# Patient Record
Sex: Female | Born: 1995 | Race: White | Hispanic: No | Marital: Married
Health system: Midwestern US, Community
[De-identification: ages and names within clinical notes are randomized; demographics above are authoritative.]

## PROBLEM LIST (undated history)

## (undated) DIAGNOSIS — R519 Headache, unspecified: Secondary | ICD-10-CM

## (undated) DIAGNOSIS — F419 Anxiety disorder, unspecified: Secondary | ICD-10-CM

## (undated) DIAGNOSIS — J45909 Unspecified asthma, uncomplicated: Secondary | ICD-10-CM

## (undated) DIAGNOSIS — K219 Gastro-esophageal reflux disease without esophagitis: Secondary | ICD-10-CM

## (undated) DIAGNOSIS — J309 Allergic rhinitis, unspecified: Secondary | ICD-10-CM

## (undated) DIAGNOSIS — J4520 Mild intermittent asthma, uncomplicated: Secondary | ICD-10-CM

## (undated) DIAGNOSIS — E66812 Obesity, class 2: Secondary | ICD-10-CM

## (undated) DIAGNOSIS — J4531 Mild persistent asthma with (acute) exacerbation: Principal | ICD-10-CM

## (undated) DIAGNOSIS — E6609 Other obesity due to excess calories: Secondary | ICD-10-CM

## (undated) DIAGNOSIS — F411 Generalized anxiety disorder: Secondary | ICD-10-CM

## (undated) DIAGNOSIS — Z6839 Body mass index (BMI) 39.0-39.9, adult: Secondary | ICD-10-CM

## (undated) DIAGNOSIS — Z0001 Encounter for general adult medical examination with abnormal findings: Secondary | ICD-10-CM

## (undated) HISTORY — PX: EYE SURGERY: SHX253

## (undated) HISTORY — PX: OTHER SURGICAL HISTORY: SHX169

## (undated) HISTORY — DX: Anxiety disorder, unspecified: F41.9

## (undated) HISTORY — DX: Unspecified asthma, uncomplicated: J45.909

## (undated) HISTORY — DX: Allergic rhinitis, unspecified: J30.9

## (undated) HISTORY — DX: Headache, unspecified: R51.9

## (undated) HISTORY — DX: Gastro-esophageal reflux disease without esophagitis: K21.9

## (undated) HISTORY — PX: TYMPANOSTOMY TUBE PLACEMENT: SHX32

---
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
## 2014-01-20 ENCOUNTER — Other Ambulatory Visit: Payer: Self-pay | Admitting: Gynecology

## 2014-01-21 ENCOUNTER — Ambulatory Visit (INDEPENDENT_AMBULATORY_CARE_PROVIDER_SITE_OTHER): Payer: Federal, State, Local not specified - PPO | Admitting: Gynecology

## 2014-01-21 ENCOUNTER — Encounter: Payer: Self-pay | Admitting: Gynecology

## 2014-01-21 VITALS — BP 120/82 | Ht 63.0 in | Wt 185.0 lb

## 2014-01-21 DIAGNOSIS — B3731 Acute candidiasis of vulva and vagina: Secondary | ICD-10-CM

## 2014-01-21 DIAGNOSIS — B373 Candidiasis of vulva and vagina: Secondary | ICD-10-CM

## 2014-01-21 DIAGNOSIS — Z833 Family history of diabetes mellitus: Secondary | ICD-10-CM

## 2014-01-21 DIAGNOSIS — N898 Other specified noninflammatory disorders of vagina: Secondary | ICD-10-CM

## 2014-01-21 DIAGNOSIS — Z01419 Encounter for gynecological examination (general) (routine) without abnormal findings: Secondary | ICD-10-CM

## 2014-01-21 DIAGNOSIS — R635 Abnormal weight gain: Secondary | ICD-10-CM

## 2014-01-21 LAB — CBC WITH DIFFERENTIAL/PLATELET
BASOS ABS: 0 10*3/uL (ref 0.0–0.1)
Basophils Relative: 0 % (ref 0–1)
EOS ABS: 0.3 10*3/uL (ref 0.0–0.7)
EOS PCT: 4 % (ref 0–5)
HCT: 41.1 % (ref 36.0–46.0)
Hemoglobin: 14.2 g/dL (ref 12.0–15.0)
LYMPHS PCT: 32 % (ref 12–46)
Lymphs Abs: 2.4 10*3/uL (ref 0.7–4.0)
MCH: 32 pg (ref 26.0–34.0)
MCHC: 34.5 g/dL (ref 30.0–36.0)
MCV: 92.6 fL (ref 78.0–100.0)
Monocytes Absolute: 0.7 10*3/uL (ref 0.1–1.0)
Monocytes Relative: 9 % (ref 3–12)
NEUTROS PCT: 55 % (ref 43–77)
Neutro Abs: 4.2 10*3/uL (ref 1.7–7.7)
PLATELETS: 282 10*3/uL (ref 150–400)
RBC: 4.44 MIL/uL (ref 3.87–5.11)
RDW: 12.9 % (ref 11.5–15.5)
WBC: 7.6 10*3/uL (ref 4.0–10.5)

## 2014-01-21 LAB — WET PREP FOR TRICH, YEAST, CLUE
Clue Cells Wet Prep HPF POC: NONE SEEN
Trich, Wet Prep: NONE SEEN

## 2014-01-21 MED ORDER — FLUCONAZOLE 150 MG PO TABS: 150.0000 mg | ORAL_TABLET | Freq: Once | ORAL | Status: DC

## 2014-01-21 MED ORDER — NORGESTIMATE-ETH ESTRADIOL 0.25-35 MG-MCG PO TABS: 1.0000 | ORAL_TABLET | Freq: Every day | ORAL | Status: DC

## 2014-01-21 NOTE — Patient Instructions (Addendum)
Human Papillomavirus (HPV) Gardasil Vaccine What You Need to Know WHAT IS HPV?  Genital human papillomavirus (HPV) is the most common sexually transmitted virus in the [place]. More than half of sexually active men and women are infected with HPV at some time in their lives.  About 20 million Americans are currently infected, and about 6 million more get infected each year. HPV is usually spread through sexual contact.  Most HPV infections do not cause any symptoms and go away on their own. But HPV can cause cervical cancer in women. Cervical cancer is the 2nd leading cause of cancer deaths among women around the world. In the [place], about 12,000 women get cervical cancer every year and about 4,000 are expected to die from it.  HPV is also associated with several less common cancers, such as vaginal and vulvar cancers in women, and anal and oropharyngeal (back of the throat, including base of tongue and tonsils) cancers in both men and women. HPV can also cause genital warts and warts in the throat.  There is no cure for HPV infection, but some of the problems it causes can be treated. HPV VACCINE: WHY GET VACCINATED?  The HPV vaccine you are getting is 1 of 2 vaccines that can be given to prevent HPV. It may be given to both males and females.  This vaccine can prevent most cases of cervical cancer in females, if it is given before exposure to the virus. In addition, it can prevent vaginal and vulvar cancer in females, and genital warts and anal cancer in both males and females.  Protection from HPV vaccine is expected to be long-lasting. But vaccination is not a substitute for cervical cancer screening. Women should still get regular Pap tests. WHO SHOULD GET THIS HPV VACCINE AND WHEN? HPV vaccine is given as a 3-dose series.  1st Dose: Now.  2nd Dose: 1 to 2 months after Dose 1.  3rd Dose: 6 months after Dose 1. Additional (booster) doses are not recommended. Routine  Vaccination This HPV vaccine is recommended for girls and boys 11 or 19 years of age. It may be given starting at age 9. Why is HPV vaccine recommended at 11 or 19 years of age?  HPV infection is easily acquired, even with only one sex partner. That is why it is important to get HPV vaccine before any sexual contact takes place. Also, response to the vaccine is better at this age than at older ages. Catch-Up Vaccination This vaccine is recommended for the following people who have not completed the 3-dose series:   Females 13 through 19 years of age.  Males 13 through 19 years of age. This vaccine may be given to men 22 through 19 years of age who have not completed the 3-dose series. It is recommended for men through age 26 who have sex with men or whose immune system is weakened because of HIV infection, other illness, or medications.  HPV vaccine may be given at the same time as other vaccines. SOME PEOPLE SHOULD NOT GET HPV VACCINE OR SHOULD WAIT  Anyone who has ever had a life-threatening allergic reaction to any other component of HPV vaccine, or to a previous dose of HPV vaccine, should not get the vaccine. Tell your doctor if the person getting vaccinated has any severe allergies, including an allergy to yeast.  HPV vaccine is not recommended for pregnant women. However, receiving HPV vaccine when pregnant is not a reason to consider terminating the pregnancy.   Women who are breastfeeding may get the vaccine.  People who are mildly ill when a dose of HPV is planned can still be vaccinated. People with a moderate or severe illness should wait until they are better. WHAT ARE THE RISKS FROM THIS VACCINE?  This HPV vaccine has been used in the [place] and around the world for about 6 years and has been very safe.  However, any medicine could possibly cause a serious problem, such as a severe allergic reaction. The risk of any vaccine causing a serious injury, or death, is extremely  small.  Life-threatening allergic reactions from vaccines are very rare. If they do occur, it would be within a few minutes to a few hours after the vaccination. Several mild to moderate problems are known to occur with HPV vaccine. These do not last long and go away on their own.  Reactions in the arm where the shot was given:  Pain (about 8 people in 10).  Redness or swelling (about 1 person in 4).  Fever:  Mild (100 F or 37.8 C) (about 1 person in 10).  Moderate (102 F or 38.9 C) (about 1 person in 3765).  Other problems:  Headache (about 1 person in 3).  Fainting: Brief fainting spells and related symptoms (such as jerking movements) can happen after any medical procedure, including vaccination. Sitting or lying down for about 15 minutes after a vaccination can help prevent fainting and injuries caused by falls. Tell your doctor if the patient feels dizzy or lightheaded, or has vision changes or ringing in the ears.  Like all vaccines, HPV vaccines will continue to be monitored for unusual or severe problems. WHAT IF THERE IS A SERIOUS REACTION? What should I look for?  Any unusual condition, such as a high fever or unusual behavior. Signs of a serious allergic reaction can include difficulty breathing, hoarseness or wheezing, hives, paleness, weakness, a fast heartbeat, or dizziness. What should I do?  Call a doctor, or get the person to a doctor right away.  Tell your doctor what happened, the date and time it happened, and when the vaccination was given.  Ask your doctor, nurse, or health department to report the reaction by filing a Vaccine Adverse Event Reporting System (VAERS) form. Or, you can file this report through the VAERS website at www.vaers.LAgents.nohhs.gov or by calling 1-954-285-6046. VAERS does not provide medical advice. THE NATIONAL VACCINE INJURY COMPENSATION PROGRAM  The National Vaccine Injury Compensation Program (VICP) is a federal program that was created  to compensate people who may have been injured by certain vaccines.  Persons who believe they may have been injured by a vaccine can learn about the program and about filing a claim by calling 1-206-056-4532 or visiting the VICP website at SpiritualWord.atwww.hrsa.gov/vaccinecompensation HOW CAN I LEARN MORE?  Ask your doctor.  Call your local or state health department.  Contact the Centers for Disease Control and Prevention (CDC):  Call (450)754-90751-630 246 6216 (1-800-CDC-INFO)  or  Visit CDC's website at PicCapture.uywww.cdc.gov/vaccines CDC Human Papillomavirus (HPV) Gardasil (Interim) 04/28/12 Document Released: 09/26/2006 Document Revised: 09/19/2013 Document Reviewed: 03/20/2013 [facility] Patient Information 2014 [place], [place]. Breast Self-Awareness Practicing breast self-awareness may pick up problems early, prevent significant medical complications, and possibly save your life. By practicing breast self-awareness, you can become familiar with how your breasts look and feel and if your breasts are changing. This allows you to notice changes early. It can also offer you some reassurance that your breast health is good. One way to learn  what is normal for your breasts and whether your breasts are changing is to do a breast self-exam. If you find a lump or something that was not present in the past, it is best to contact your caregiver right away. Other findings that should be evaluated by your caregiver include nipple discharge, especially if it is bloody; skin changes or reddening; areas where the skin seems to be pulled in (retracted); or new lumps and bumps. Breast pain is seldom associated with cancer (malignancy), but should also be evaluated by a caregiver. HOW TO PERFORM A BREAST SELF-EXAM The best time to examine your breasts is 5 7 days after your menstrual period is over. During menstruation, the breasts are lumpier, and it may be more difficult to pick up changes. If you do not menstruate, have reached menopause, or  had your uterus removed (hysterectomy), you should examine your breasts at regular intervals, such as monthly. If you are breastfeeding, examine your breasts after a feeding or after using a breast pump. Breast implants do not decrease the risk for lumps or tumors, so continue to perform breast self-exams as recommended. Talk to your caregiver about how to determine the difference between the implant and breast tissue. Also, talk about the amount of pressure you should use during the exam. Over time, you will become more familiar with the variations of your breasts and more comfortable with the exam. A breast self-exam requires you to remove all your clothes above the waist. 1. Look at your breasts and nipples. Stand in front of a mirror in a room with good lighting. With your hands on your hips, push your hands firmly downward. Look for a difference in shape, contour, and size from one breast to the other (asymmetry). Asymmetry includes puckers, dips, or bumps. Also, look for skin changes, such as reddened or scaly areas on the breasts. Look for nipple changes, such as discharge, dimpling, repositioning, or redness. 2. Carefully feel your breasts. This is best done either in the shower or tub while using soapy water or when flat on your back. Place the arm (on the side of the breast you are examining) above your head. Use the pads (not the fingertips) of your three middle fingers on your opposite hand to feel your breasts. Start in the underarm area and use  inch (2 cm) overlapping circles to feel your breast. Use 3 different levels of pressure (light, medium, and firm pressure) at each circle before moving to the next circle. The light pressure is needed to feel the tissue closest to the skin. The medium pressure will help to feel breast tissue a little deeper, while the firm pressure is needed to feel the tissue close to the ribs. Continue the overlapping circles, moving downward over the breast until you  feel your ribs below your breast. Then, move one finger-width towards the center of the body. Continue to use the  inch (2 cm) overlapping circles to feel your breast as you move slowly up toward the collar bone (clavicle) near the base of the neck. Continue the up and down exam using all 3 pressures until you reach the middle of the chest. Do this with each breast, carefully feeling for lumps or changes. 3.  Keep a written record with breast changes or normal findings for each breast. By writing this information down, you do not need to depend only on memory for size, tenderness, or location. Write down where you are in your menstrual cycle, if you are still  menstruating. Breast tissue can have some lumps or thick tissue. However, see your caregiver if you find anything that concerns you.  SEEK MEDICAL CARE IF:  You see a change in shape, contour, or size of your breasts or nipples.   You see skin changes, such as reddened or scaly areas on the breasts or nipples.   You have an unusual discharge from your nipples.   You feel a new lump or unusually thick areas.  Document Released: 11/29/2005 Document Revised: 11/15/2012 Document Reviewed: 03/15/2012 [facility] Patient Information 2014 [place], [place]. Monilial Vaginitis Vaginitis in a soreness, swelling and redness (inflammation) of the vagina and vulva. Monilial vaginitis is not a sexually transmitted infection. CAUSES  Yeast vaginitis is caused by yeast (candida) that is normally found in your vagina. With a yeast infection, the candida has overgrown in number to a point that upsets the chemical balance. SYMPTOMS  White, thick vaginal discharge. Swelling, itching, redness and irritation of the vagina and possibly the lips of the vagina (vulva). Burning or painful urination. Painful intercourse. DIAGNOSIS  Things that may contribute to monilial vaginitis are: Postmenopausal and virginal states. Pregnancy. Infections. Being tired,  sick or stressed, especially if you had monilial vaginitis in the past. Diabetes. Good control will help lower the chance. Birth control pills. Tight fitting garments. Using bubble bath, feminine sprays, douches or deodorant tampons. Taking certain medications that kill germs (antibiotics). Sporadic recurrence can occur if you become ill. TREATMENT  Your caregiver will give you medication. There are several kinds of anti monilial vaginal creams and suppositories specific for monilial vaginitis. For recurrent yeast infections, use a suppository or cream in the vagina 2 times a week, or as directed. Anti-monilial or steroid cream for the itching or irritation of the vulva may also be used. Get your caregiver's permission. Painting the vagina with methylene blue solution may help if the monilial cream does not work. Eating yogurt may help prevent monilial vaginitis. HOME CARE INSTRUCTIONS  Finish all medication as prescribed. Do not have sex until treatment is completed or after your caregiver tells you it is okay. Take warm sitz baths. Do not douche. Do not use tampons, especially scented ones. Wear cotton underwear. Avoid tight pants and panty hose. Tell your sexual partner that you have a yeast infection. They should go to their caregiver if they have symptoms such as mild rash or itching. Your sexual partner should be treated as well if your infection is difficult to eliminate. Practice safer sex. Use condoms. Some vaginal medications cause latex condoms to fail. Vaginal medications that harm condoms are: Cleocin cream. Butoconazole (Femstat). Terconazole (Terazol) vaginal suppository. Miconazole (Monistat) (may be purchased over the counter). SEEK MEDICAL CARE IF:  You have a temperature by mouth above 102 F (38.9 C). The infection is getting worse after 2 days of treatment. The infection is not getting better after 3 days of treatment. You develop blisters in or around your  vagina. You develop vaginal bleeding, and it is not your menstrual period. You have pain when you urinate. You develop intestinal problems. You have pain with sexual intercourse. Document Released: 09/08/2005 Document Revised: 02/21/2012 Document Reviewed: 05/23/2009 [facility] Patient Information 2014 [place], [place].

## 2014-01-21 NOTE — Addendum Note (Signed)
Addended by: Bertram SavinGONZALEZ-CASTILLO, BLANCA A on: 01/21/2014 04:21 PM   Modules accepted: Orders

## 2014-01-21 NOTE — Progress Notes (Signed)
Martha Yu 1996/01/03 [id number]   History:    18 y.o.  for annual gyn exam with complaint of a slight vaginal discharge. Patient has never been sexually active. No prior Pap smears. Patient is doing well on Sprintec 28 day oral contraceptive pill. She is having less cramping and not as heavy menstrual cycles. Patient has declined the HPV vaccine. Her flu vaccine is up to date.  Past medical history,surgical history, family history and social history were all reviewed and documented in the EPIC chart.  Gynecologic History Patient's last menstrual period was 01/02/2014. Contraception: OCP (estrogen/progesterone) Last Pap: No prior study. Results were: No prior study Last mammogram: Not indicated. Results were: None indicated  Obstetric History OB History  Gravida Para Term Preterm AB SAB TAB Ectopic Multiple Living  0                  ROS: A ROS was performed and pertinent positives and negatives are included in the history.  GENERAL: No fevers or chills. HEENT: No change in vision, no earache, sore throat or sinus congestion. NECK: No pain or stiffness. CARDIOVASCULAR: No chest pain or pressure. No palpitations. PULMONARY: No shortness of breath, cough or wheeze. GASTROINTESTINAL: No abdominal pain, nausea, vomiting or diarrhea, melena or bright red blood per rectum. GENITOURINARY: No urinary frequency, urgency, hesitancy or dysuria. MUSCULOSKELETAL: No joint or muscle pain, no back pain, no recent trauma. DERMATOLOGIC: No rash, no itching, no lesions. ENDOCRINE: No polyuria, polydipsia, no heat or cold intolerance. No recent change in weight. HEMATOLOGICAL: No anemia or easy bruising or bleeding. NEUROLOGIC: No headache, seizures, numbness, tingling or weakness. PSYCHIATRIC: No depression, no loss of interest in normal activity or change in sleep pattern.     Exam: chaperone present  BP 120/82  Ht 5\' 3"  (1.6 m)  Wt 185 lb (83.915 kg)  BMI 32.78 kg/m2  LMP  01/02/2014  Body mass index is 32.78 kg/(m^2).  General appearance : Well developed well nourished female. No acute distress HEENT: Neck supple, trachea midline, no carotid bruits, no thyroidmegaly Lungs: Clear to auscultation, no rhonchi or wheezes, or rib retractions  Heart: Regular rate and rhythm, no murmurs or gallops Breast:Examined in sitting and supine position were symmetrical in appearance, no palpable masses or tenderness,  no skin retraction, no nipple inversion, no nipple discharge, no skin discoloration, no axillary or supraclavicular lymphadenopathy Abdomen: no palpable masses or tenderness, no rebound or guarding Extremities: no edema or skin discoloration or tenderness  Pelvic:  Bartholin, Urethra, Skene Glands: Within normal limits, virginal introitus             Vagina: No gross lesions or discharge  Cervix: No gross lesions or discharge  Uterus  anteverted, normal size, shape and consistency, non-tender and mobile  Adnexa  Without masses or tenderness  Anus and perineum  normal   Rectovaginal  normal sphincter tone without palpated masses or tenderness             Hemoccult not indicated   Wet prep: Yeast  Assessment/Plan:  18 y.o. female for annual exam with vaginal yeast infection. Diflucan 150 mg one by mouth with prescribed. No Pap smear done today in accordance to the new guidelines. A CBC, urinalysis, TSH and hemoglobin A1c will be drawn today. Patient is slightly overweight. Patient was found to have a history of diabetes. Patient was given instructions on self breast examination.  Note: This dictation was prepared with  Dragon/digital dictation along withSmart phrase technology.  Any transcriptional errors that result from this process are unintentional.   Ok EdwardsFERNANDEZ,JUAN H MD, 4:13 PM 01/21/2014

## 2014-01-22 LAB — URINALYSIS W MICROSCOPIC + REFLEX CULTURE
BACTERIA UA: NONE SEEN
Bilirubin Urine: NEGATIVE
CASTS: NONE SEEN
Crystals: NONE SEEN
GLUCOSE, UA: NEGATIVE mg/dL
HGB URINE DIPSTICK: NEGATIVE
KETONES UR: NEGATIVE mg/dL
Nitrite: NEGATIVE
PH: 7 (ref 5.0–8.0)
Protein, ur: NEGATIVE mg/dL
Specific Gravity, Urine: 1.014 (ref 1.005–1.030)
Urobilinogen, UA: 0.2 mg/dL (ref 0.0–1.0)

## 2014-01-22 LAB — HEMOGLOBIN A1C
Hgb A1c MFr Bld: 4.8 % (ref <5.7)
Mean Plasma Glucose: 91 mg/dL (ref <117)

## 2014-01-22 LAB — TSH: TSH: 1.903 u[IU]/mL (ref 0.350–4.500)

## 2014-01-23 LAB — URINE CULTURE: Colony Count: 50000

## 2014-04-18 ENCOUNTER — Telehealth: Payer: Self-pay | Admitting: Women's Health
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
## 2014-04-19 ENCOUNTER — Telehealth: Payer: Self-pay | Admitting: Women's Health

## 2014-04-19 NOTE — Telephone Encounter (Signed)
Telephone call, [facility], encouraged to come to office. States will think about it.

## 2016-10-12 DIAGNOSIS — R6889 Other general symptoms and signs: Secondary | ICD-10-CM | POA: Diagnosis not present

## 2016-10-12 DIAGNOSIS — K219 Gastro-esophageal reflux disease without esophagitis: Secondary | ICD-10-CM | POA: Diagnosis not present

## 2016-10-12 DIAGNOSIS — F419 Anxiety disorder, unspecified: Secondary | ICD-10-CM | POA: Diagnosis not present

## 2017-02-19 DIAGNOSIS — J020 Streptococcal pharyngitis: Secondary | ICD-10-CM | POA: Diagnosis not present

## 2017-12-23 DIAGNOSIS — H1032 Unspecified acute conjunctivitis, left eye: Secondary | ICD-10-CM | POA: Diagnosis not present

## 2017-12-23 DIAGNOSIS — J069 Acute upper respiratory infection, unspecified: Secondary | ICD-10-CM | POA: Diagnosis not present

## 2018-02-27 DIAGNOSIS — F411 Generalized anxiety disorder: Secondary | ICD-10-CM | POA: Diagnosis not present

## 2018-02-27 DIAGNOSIS — J4520 Mild intermittent asthma, uncomplicated: Secondary | ICD-10-CM | POA: Diagnosis not present

## 2018-02-27 DIAGNOSIS — K219 Gastro-esophageal reflux disease without esophagitis: Secondary | ICD-10-CM | POA: Diagnosis not present

## 2018-03-03 DIAGNOSIS — J020 Streptococcal pharyngitis: Secondary | ICD-10-CM | POA: Diagnosis not present

## 2018-03-10 ENCOUNTER — Encounter (HOSPITAL_COMMUNITY): Payer: Self-pay

## 2018-03-10 ENCOUNTER — Other Ambulatory Visit: Payer: Self-pay

## 2018-03-10 ENCOUNTER — Emergency Department (HOSPITAL_COMMUNITY)
Admission: EM | Admit: 2018-03-10 | Discharge: 2018-03-10 | Disposition: A | Discharge status: Home / Self Care | Payer: Federal, State, Local not specified - PPO | Attending: Emergency Medicine | Admitting: Emergency Medicine

## 2018-03-10 ENCOUNTER — Emergency Department (HOSPITAL_COMMUNITY): Payer: Federal, State, Local not specified - PPO

## 2018-03-10 DIAGNOSIS — R51 Headache: Secondary | ICD-10-CM | POA: Diagnosis not present

## 2018-03-10 DIAGNOSIS — R945 Abnormal results of liver function studies: Secondary | ICD-10-CM | POA: Diagnosis not present

## 2018-03-10 DIAGNOSIS — R69 Illness, unspecified: Secondary | ICD-10-CM

## 2018-03-10 DIAGNOSIS — Z79899 Other long term (current) drug therapy: Secondary | ICD-10-CM | POA: Diagnosis not present

## 2018-03-10 DIAGNOSIS — R740 Nonspecific elevation of levels of transaminase and lactic acid dehydrogenase [LDH]: Secondary | ICD-10-CM

## 2018-03-10 DIAGNOSIS — J111 Influenza due to unidentified influenza virus with other respiratory manifestations: Secondary | ICD-10-CM | POA: Diagnosis not present

## 2018-03-10 DIAGNOSIS — R7401 Elevation of levels of liver transaminase levels: Secondary | ICD-10-CM

## 2018-03-10 DIAGNOSIS — R102 Pelvic and perineal pain: Secondary | ICD-10-CM | POA: Diagnosis not present

## 2018-03-10 DIAGNOSIS — G44209 Tension-type headache, unspecified, not intractable: Secondary | ICD-10-CM

## 2018-03-10 DIAGNOSIS — K5900 Constipation, unspecified: Secondary | ICD-10-CM | POA: Diagnosis not present

## 2018-03-10 LAB — URINALYSIS, ROUTINE W REFLEX MICROSCOPIC
Glucose, UA: NEGATIVE mg/dL
KETONES UR: NEGATIVE mg/dL
LEUKOCYTES UA: NEGATIVE
NITRITE: NEGATIVE
PROTEIN: NEGATIVE mg/dL
SPECIFIC GRAVITY, URINE: 1.018 (ref 1.005–1.030)
pH: 6 (ref 5.0–8.0)

## 2018-03-10 LAB — COMPREHENSIVE METABOLIC PANEL
ALT: 317 U/L — ABNORMAL HIGH (ref 14–54)
ANION GAP: 9 (ref 5–15)
AST: 156 U/L — AB (ref 15–41)
Albumin: 3.4 g/dL — ABNORMAL LOW (ref 3.5–5.0)
Alkaline Phosphatase: 289 U/L — ABNORMAL HIGH (ref 38–126)
BILIRUBIN TOTAL: 2.3 mg/dL — AB (ref 0.3–1.2)
BUN: <5 mg/dL — ABNORMAL LOW (ref 6–20)
CO2: 23 mmol/L (ref 22–32)
Calcium: 9 mg/dL (ref 8.9–10.3)
Chloride: 105 mmol/L (ref 101–111)
Creatinine, Ser: 0.85 mg/dL (ref 0.44–1.00)
GFR calc Af Amer: >60 mL/min (ref >60)
Glucose, Bld: 93 mg/dL (ref 65–99)
POTASSIUM: 4.4 mmol/L (ref 3.5–5.1)
Sodium: 137 mmol/L (ref 135–145)
TOTAL PROTEIN: 7.1 g/dL (ref 6.5–8.1)

## 2018-03-10 LAB — CBC
HEMATOCRIT: 37.2 % (ref 36.0–46.0)
HEMOGLOBIN: 11.9 g/dL — AB (ref 12.0–15.0)
MCH: 28.5 pg (ref 26.0–34.0)
MCHC: 32 g/dL (ref 30.0–36.0)
MCV: 89.2 fL (ref 78.0–100.0)
Platelets: 123 10*3/uL — ABNORMAL LOW (ref 150–400)
RBC: 4.17 MIL/uL (ref 3.87–5.11)
RDW: 15.7 % — ABNORMAL HIGH (ref 11.5–15.5)
WBC: 11.7 10*3/uL — AB (ref 4.0–10.5)

## 2018-03-10 LAB — LIPASE, BLOOD: Lipase: 25 U/L (ref 11–51)

## 2018-03-10 LAB — I-STAT BETA HCG BLOOD, ED (MC, WL, AP ONLY)

## 2018-03-10 MED ORDER — PROCHLORPERAZINE EDISYLATE 5 MG/ML IJ SOLN
10.0000 mg | Freq: Once | INTRAMUSCULAR | Status: AC
  Administered 2018-03-10: 10 mg via INTRAVENOUS
  Filled 2018-03-10: qty 2

## 2018-03-10 MED ORDER — DIPHENHYDRAMINE HCL 50 MG/ML IJ SOLN
12.5000 mg | Freq: Once | INTRAMUSCULAR | Status: AC
  Administered 2018-03-10: 12.5 mg via INTRAVENOUS
  Filled 2018-03-10: qty 1

## 2018-03-10 MED ORDER — NAPROXEN 500 MG PO TABS: 500.0000 mg | ORAL_TABLET | Freq: Two times a day (BID) | ORAL | 0 refills | Status: DC

## 2018-03-10 MED ORDER — SODIUM CHLORIDE 0.9 % IV BOLUS
1000.0000 mL | Freq: Once | INTRAVENOUS | Status: AC
  Administered 2018-03-10: 1000 mL via INTRAVENOUS

## 2018-03-10 MED ORDER — KETOROLAC TROMETHAMINE 30 MG/ML IJ SOLN
30.0000 mg | Freq: Once | INTRAMUSCULAR | Status: AC
  Administered 2018-03-10: 30 mg via INTRAVENOUS
  Filled 2018-03-10: qty 1

## 2018-03-10 MED ORDER — ONDANSETRON 4 MG PO TBDP: 4.0000 mg | ORAL_TABLET | Freq: Three times a day (TID) | ORAL | 0 refills | Status: DC | PRN

## 2018-03-10 NOTE — ED Triage Notes (Signed)
Pt states right side flank pain, dark urine, vomiting, back pain, and headache. Pt reports chills but unsure if she has had a fever.

## 2018-03-10 NOTE — ED Provider Notes (Addendum)
[facility] Provider Note   CSN: [id number] Arrival date & time: 03/10/18  1034     History   Chief Complaint Chief Complaint  Patient presents with  . Emesis  . Headache    HPI Martha Yu is a 22 y.o. female who presents to ED for evaluation of 6-day history of left-sided flank pain, intermittent right-sided abdominal pain, dark urine, ongoing nausea, generalized body aches, headache.  She also reports several episodes of NBNB emesis since this morning.  The right sided abdominal pain occurs intermittently after eating a meal.  Prior to today, she had persistent nausea anytime she tried to eat something.  She took 1 dose of Aleve with no improvement in her symptoms last week.  She reports intermittent chills.  Sick contacts at home with similar symptoms including influenza-like symptoms.  She also reports constipation with last bowel movement being this morning but reports smaller amount than usual.  Denies any dysuria, hematuria, hematochezia or melena, vaginal complaints, shortness of breath, chest pain, hemoptysis, URI symptoms.  She denies tobacco use but reports occasional alcohol use.  Denies any alcohol use in the past 2 weeks.  HPI  History reviewed. No pertinent past medical history.  Patient Active Problem List   Diagnosis Date Noted  . H/O menorrhagia 12/15/2012  . H/O dysmenorrhea 12/15/2012  . Acne 12/15/2012  . Family history of diabetes mellitus 12/15/2011    Past Surgical History:  Procedure Laterality Date  . ARM SURGERY     RIGHT  . EYE SURGERY    . TYMPANOSTOMY TUBE PLACEMENT       OB History    Gravida  0   Para      Term      Preterm      AB      Living        SAB      TAB      Ectopic      Multiple      Live Births               Home Medications    Prior to Admission medications   Medication Sig Start Date End Date Taking? Authorizing Provider  fluconazole (DIFLUCAN) 150 MG  tablet Take 1 tablet (150 mg total) by mouth once. 01/21/14   Ok EdwardsFernandez, Juan H, MD  naproxen (NAPROSYN) 500 MG tablet Take 1 tablet (500 mg total) by mouth 2 (two) times daily. 03/10/18   Sean Malinowski, PA-C  norgestimate-ethinyl estradiol (SPRINTEC 28) 0.25-35 MG-MCG tablet Take 1 tablet by mouth daily. 01/21/14   Ok EdwardsFernandez, Juan H, MD  ondansetron (ZOFRAN ODT) 4 MG disintegrating tablet Take 1 tablet (4 mg total) by mouth every 8 (eight) hours as needed for nausea or vomiting. 03/10/18   Dietrich PatesKhatri, Macarthur Lorusso, PA-C    Family History Family History  Problem Relation Age of Onset  . Diabetes Mother   . Cancer Mother        UTERINE  . Hypertension Father   . Breast cancer Maternal Aunt   . Breast cancer Maternal Grandmother     Social History Social History   Tobacco Use  . Smoking status: Never Smoker  . Smokeless tobacco: Never Used  Substance Use Topics  . Alcohol use: No  . Drug use: No     Allergies   Patient has no known allergies.   Review of Systems Review of Systems  Constitutional: Positive for chills. Negative for appetite change and fever.  HENT: Negative for ear pain, rhinorrhea, sneezing and sore throat.   Eyes: Negative for photophobia and visual disturbance.  Respiratory: Negative for cough, chest tightness, shortness of breath and wheezing.   Cardiovascular: Negative for chest pain and palpitations.  Gastrointestinal: Positive for abdominal pain, constipation, nausea and vomiting. Negative for blood in stool and diarrhea.  Genitourinary: Positive for flank pain. Negative for dysuria, hematuria and urgency.  Musculoskeletal: Negative for myalgias, neck pain and neck stiffness.  Skin: Negative for rash.  Neurological: Positive for headaches. Negative for dizziness, weakness and light-headedness.     Physical Exam Updated Vital Signs BP 109/69 (BP Location: Left Arm)   Pulse 88   Temp 98.7 F (37.1 C) (Oral)   Resp 17   LMP 03/10/2018 (Exact Date)   SpO2 100%    Physical Exam  Constitutional: She appears well-developed and well-nourished. No distress.  HENT:  Head: Normocephalic and atraumatic.  Nose: Nose normal.  Eyes: Pupils are equal, round, and reactive to light. Conjunctivae and EOM are normal. Right eye exhibits no discharge. Left eye exhibits no discharge. No scleral icterus.  Neck: Normal range of motion. Neck supple.  No meningeal signs.  Cardiovascular: Normal rate, regular rhythm, normal heart sounds and intact distal pulses. Exam reveals no gallop and no friction rub.  No murmur heard. Pulmonary/Chest: Effort normal and breath sounds normal. No respiratory distress.  Abdominal: Soft. Bowel sounds are normal. She exhibits no distension. There is no tenderness. There is no guarding.  No abdominal tenderness to palpation currently.  Musculoskeletal: Normal range of motion. She exhibits no edema.  Neurological: She is alert. She exhibits normal muscle tone. Coordination normal.  Skin: Skin is warm and dry. No rash noted.  Psychiatric: She has a normal mood and affect.  Nursing note and vitals reviewed.    ED Treatments / Results  Labs (all labs ordered are listed, but only abnormal results are displayed) Labs Reviewed  COMPREHENSIVE METABOLIC PANEL - Abnormal; Notable for the following components:      Result Value   BUN <5 (*)    Albumin 3.4 (*)    AST 156 (*)    ALT 317 (*)    Alkaline Phosphatase 289 (*)    Total Bilirubin 2.3 (*)    All other components within normal limits  CBC - Abnormal; Notable for the following components:   WBC 11.7 (*)    Hemoglobin 11.9 (*)    RDW 15.7 (*)    Platelets 123 (*)    All other components within normal limits  URINALYSIS, ROUTINE W REFLEX MICROSCOPIC - Abnormal; Notable for the following components:   Color, Urine AMBER (*)    Hgb urine dipstick SMALL (*)    Bilirubin Urine MODERATE (*)    Bacteria, UA RARE (*)    Squamous Epithelial / LPF 0-5 (*)    All other components  within normal limits  LIPASE, BLOOD  HEPATITIS PANEL, ACUTE  I-STAT BETA HCG BLOOD, ED ([facility], [facility], [facility] ONLY)    EKG None  Radiology US Abdomen Limited  Result Date: 03/10/2018 CLINICAL DATA:  Right upper quadrant pain with elevated liver enzymes EXAM: ULTRASOUND ABDOMEN LIMITED RIGHT UPPER QUADRANT COMPARISON:  None. FINDINGS: Gallbladder: No gallstones or wall thickening visualized. There is no pericholecystic fluid. No sonographic Murphy sign noted by sonographer. Common bile duct: Diameter: 2 mm. No intrahepatic or extrahepatic biliary duct dilatation. Liver: No focal lesion identified. Within normal limits in parenchymal echogenicity. Portal vein is patent on color Doppler imaging  with normal direction of blood flow towards the liver. IMPRESSION: Study within normal limits. Electronically Signed   By: Bretta Bang III M.D.   On: 03/10/2018 16:54    Procedures Procedures (including critical care time)  Medications Ordered in ED Medications  ketorolac (TORADOL) 30 MG/ML injection 30 mg (has no administration in time range)  sodium chloride 0.9 % bolus 1,000 mL (1,000 mLs Intravenous New Bag/Given 03/10/18 1604)  prochlorperazine (COMPAZINE) injection 10 mg (10 mg Intravenous Given 03/10/18 1604)  diphenhydrAMINE (BENADRYL) injection 12.5 mg (12.5 mg Intravenous Given 03/10/18 1604)     Initial Impression / Assessment and Plan / ED Course  I have reviewed the triage vital signs and the nursing notes.  Pertinent labs & imaging results that were available during my care of the patient were reviewed by me and considered in my medical decision making (see chart for details).     Patient presents to ED for evaluation of multiple complaints.  She reports 6-day history of left-sided flank pain, intermittent right-sided abdominal pain after eating, dark urine, nausea, generalized body aches, headache, emesis since this morning.  She is afebrile with no recent use of antipyretics.  She is  overall well-appearing and she has no abdominal tenderness to palpation on my examination.  Sick contacts at home with similar symptoms.  Denies dysuria, hematuria, blood in stool or vomit, vaginal complaints, chest pain or shortness of breath.  Lab work significant for transaminitis and increase in T bili.  WBC mildly elevated at 11.7.  Remainder of lab work is unremarkable.  Urinalysis with no evidence of UTI.  Right upper quadrant ultrasound was negative with no changes in liver or gallbladder.  Suspect that her transaminitis and other symptoms are viral in nature.  I did pull a hepatitis panel on her which is pending at this time.  Patient was given migraine cocktail with complete resolution of her symptoms.  She is able to tolerate p.o. intake without difficulty here. Will advise her to follow-up with PCP for further evaluation and recheck of her LFTs. Given Zofran, anti-inflammatories to help with symptoms. There are no headache characteristics that are lateralizing or concerning for increased ICP, infectious or vascular cause of symptoms.   Patient appears stable for discharge at this time. Strict return precautions given.  Portions of this note were generated with Scientist, clinical (histocompatibility and immunogenetics). Dictation errors may occur despite best attempts at proofreading.   Final Clinical Impressions(s) / ED Diagnoses   Final diagnoses:  Transaminitis  Influenza-like illness  Tension-type headache, not intractable, unspecified chronicity pattern    ED Discharge Orders        Ordered    ondansetron (ZOFRAN ODT) 4 MG disintegrating tablet  Every 8 hours PRN     03/10/18 1728    naproxen (NAPROSYN) 500 MG tablet  2 times daily     03/10/18 1728         Dietrich Pates, PA-C 03/10/18 1753    Melene Plan, DO 03/10/18 2250

## 2018-03-10 NOTE — ED Notes (Signed)
Left for US.

## 2018-03-11 LAB — HEPATITIS PANEL, ACUTE
HCV Ab: <0.1 s/co ratio (ref 0.0–0.9)
HEP A IGM: NEGATIVE
HEP B C IGM: NEGATIVE
Hepatitis B Surface Ag: NEGATIVE

## 2018-05-10 DIAGNOSIS — Z01419 Encounter for gynecological examination (general) (routine) without abnormal findings: Secondary | ICD-10-CM | POA: Diagnosis not present

## 2018-05-22 DIAGNOSIS — K080 Exfoliation of teeth due to systemic causes: Secondary | ICD-10-CM | POA: Diagnosis not present

## 2018-08-30 DIAGNOSIS — F419 Anxiety disorder, unspecified: Secondary | ICD-10-CM | POA: Diagnosis not present

## 2018-08-30 DIAGNOSIS — J4521 Mild intermittent asthma with (acute) exacerbation: Secondary | ICD-10-CM | POA: Diagnosis not present

## 2018-08-30 DIAGNOSIS — J309 Allergic rhinitis, unspecified: Secondary | ICD-10-CM | POA: Diagnosis not present

## 2018-08-30 DIAGNOSIS — K219 Gastro-esophageal reflux disease without esophagitis: Secondary | ICD-10-CM | POA: Diagnosis not present

## 2018-11-13 DIAGNOSIS — R05 Cough: Secondary | ICD-10-CM | POA: Diagnosis not present

## 2019-02-17 DIAGNOSIS — J45998 Other asthma: Secondary | ICD-10-CM | POA: Diagnosis not present

## 2019-10-30 ENCOUNTER — Encounter: Payer: Self-pay | Admitting: Pulmonary Disease

## 2019-10-30 ENCOUNTER — Other Ambulatory Visit: Payer: Self-pay

## 2019-10-30 ENCOUNTER — Ambulatory Visit: Payer: Federal, State, Local not specified - PPO | Admitting: Pulmonary Disease

## 2019-10-30 VITALS — BP 122/84 | HR 86 | Temp 97.6°F | Ht 63.0 in | Wt 223.2 lb

## 2019-10-30 DIAGNOSIS — J4530 Mild persistent asthma, uncomplicated: Secondary | ICD-10-CM | POA: Insufficient documentation

## 2019-10-30 DIAGNOSIS — K219 Gastro-esophageal reflux disease without esophagitis: Secondary | ICD-10-CM | POA: Diagnosis not present

## 2019-10-30 DIAGNOSIS — F419 Anxiety disorder, unspecified: Secondary | ICD-10-CM | POA: Diagnosis not present

## 2019-10-30 LAB — CBC WITH DIFFERENTIAL/PLATELET
Basophils Absolute: 0 10*3/uL (ref 0.0–0.1)
Basophils Relative: 0.5 % (ref 0.0–3.0)
Eosinophils Absolute: 0.4 10*3/uL (ref 0.0–0.7)
Eosinophils Relative: 5.3 % — ABNORMAL HIGH (ref 0.0–5.0)
HCT: 37.4 % (ref 36.0–46.0)
Hemoglobin: 12.1 g/dL (ref 12.0–15.0)
Lymphocytes Relative: 35.6 % (ref 12.0–46.0)
Lymphs Abs: 2.9 10*3/uL (ref 0.7–4.0)
MCHC: 32.3 g/dL (ref 30.0–36.0)
MCV: 82.8 fl (ref 78.0–100.0)
Monocytes Absolute: 0.6 10*3/uL (ref 0.1–1.0)
Monocytes Relative: 7.4 % (ref 3.0–12.0)
Neutro Abs: 4.2 10*3/uL (ref 1.4–7.7)
Neutrophils Relative %: 51.2 % (ref 43.0–77.0)
Platelets: 271 10*3/uL (ref 150.0–400.0)
RBC: 4.52 Mil/uL (ref 3.87–5.11)
RDW: 15.4 % (ref 11.5–15.5)
WBC: 8.3 10*3/uL (ref 4.0–10.5)

## 2019-10-30 MED ORDER — BREO ELLIPTA 200-25 MCG/INH IN AEPB: 1.0000 | INHALATION_SPRAY | Freq: Every day | RESPIRATORY_TRACT | 6 refills | Status: DC

## 2019-10-30 MED ORDER — BREO ELLIPTA 200-25 MCG/INH IN AEPB: 1.0000 | INHALATION_SPRAY | Freq: Every day | RESPIRATORY_TRACT | 0 refills | Status: DC

## 2019-10-30 MED ORDER — MONTELUKAST SODIUM 10 MG PO TABS: 10.0000 mg | ORAL_TABLET | Freq: Every day | ORAL | 3 refills | Status: DC

## 2019-10-30 NOTE — Addendum Note (Signed)
Addended by: Suzzanne Cloud E on: 10/30/2019 10:27 AM   Modules accepted: Orders

## 2019-10-30 NOTE — Progress Notes (Signed)
Subjective:   PATIENT ID: Martha Yu GENDER: female DOB: 07-30-1996, MRN: [id number]   HPI  Chief Complaint  Patient presents with  . Consult    recently found mold in house (1 month), sinus infection,    Reason for Visit: New consult for asthma  Ms. Martha Yu is a 23 year old female with hx of childhood asthma who presents for asthma and recent mold exposures.  She was diagnosed with asthma in her pre-teens, only having an albuterol inhaler. Previously once a day during cold and arid seasons. She recently moved out of her childhood home. She lives in a [place] in the 2000s however she has recently found mold in several rooms. Has been living in this condo for one year. At baseline, she denies shortness of breath, chest tightness or cough. However she has been her albuterol inhaler once a week. Last month, she also recently had a sinus infection for 8-9 days in mid-October manifested as sinus pressure, congestion, chest tightness and headaches. At that time she required albuterol 3-4 times a day and nocturnal awakenings once a night. No antibiotics indicated per her PCP. Her symptoms are triggered by pollen, pet dander, dust, cold weather. Spring is her worst season She endorses itch but denies rash. Her fiance has a new rash after moving into the condo. She has previously had breathing tests and treated with Breo. She is overall compliant with her inhaler since June. She has been hospitalized twice in her life for asthma.   Social History: 3 dogs (poodle and mixed breeds) and 2 shorthaired cats Grew up with animals growing up  Environmental exposures: Mold found in home  I have personally reviewed patient's past medical/family/social history, allergies, current medications.  Past Medical History:  Diagnosis Date  . Allergic rhinitis   . Asthma      Family History  Problem Relation Age of Onset  . Diabetes Mother   . Cancer Mother        UTERINE  .  Hypertension Father   . Breast cancer Maternal Aunt   . Breast cancer Maternal Grandmother   . Asthma Sister      Social History   Occupational History  . Not on file  Tobacco Use  . Smoking status: Former Smoker    Years: 1.00    Start date: 2013    Quit date: 2014    Years since quitting: 6.8  . Smokeless tobacco: Never Used  . Tobacco comment: one cig a week  Substance and Sexual Activity  . Alcohol use: No  . Drug use: No  . Sexual activity: Never    Birth control/protection: Pill    Allergies  Allergen Reactions  . Penicillins Hives     Outpatient Medications Prior to Visit  Medication Sig Dispense Refill  . albuterol (VENTOLIN HFA) 108 (90 Base) MCG/ACT inhaler INHALE 2 PUFFS EVERY 4 6 HOURS AS NEEDED    . BREO ELLIPTA 100-25 MCG/INH AEPB 1 puff daily.    . citalopram (CELEXA) 20 MG tablet Take 20 mg by mouth daily.    . pantoprazole (PROTONIX) 40 MG tablet Take 40 mg by mouth daily.    . fluconazole (DIFLUCAN) 150 MG tablet Take 1 tablet (150 mg total) by mouth once. 1 tablet 2  . naproxen (NAPROSYN) 500 MG tablet Take 1 tablet (500 mg total) by mouth 2 (two) times daily. (Patient not taking: Reported on 10/30/2019) 30 tablet 0  . norgestimate-ethinyl estradiol (SPRINTEC 28) 0.25-35 MG-MCG  tablet Take 1 tablet by mouth daily. (Patient not taking: Reported on 10/30/2019) 28 tablet 11  . ondansetron (ZOFRAN ODT) 4 MG disintegrating tablet Take 1 tablet (4 mg total) by mouth every 8 (eight) hours as needed for nausea or vomiting. (Patient not taking: Reported on 10/30/2019) 3 tablet 0   No facility-administered medications prior to visit.     Review of Systems  Constitutional: Negative for chills, diaphoresis, fever, malaise/fatigue and weight loss.  HENT: Negative for congestion, ear pain and sore throat.   Respiratory: Positive for shortness of breath. Negative for cough, hemoptysis, sputum production and wheezing.   Cardiovascular: Negative for chest pain,  palpitations and leg swelling.  Gastrointestinal: Negative for abdominal pain, heartburn and nausea.  Genitourinary: Negative for frequency.  Musculoskeletal: Negative for joint pain and myalgias.  Skin: Positive for itching. Negative for rash.       Going on for 4 months  Neurological: Negative for dizziness, weakness and headaches.  Endo/Heme/Allergies: Does not bruise/bleed easily.  Psychiatric/Behavioral: Positive for depression. The patient is nervous/anxious.      Objective:   Vitals:   10/30/19 0946 10/30/19 0947  BP:  122/84  Pulse:  86  Temp: 97.6 F (36.4 C)   TempSrc: Temporal   SpO2:  99%  Weight: 223 lb 3.2 oz (101.2 kg)   Height: 5\' 3"  (1.6 m)    SpO2: 99 % O2 Device: None (Room air)  Physical Exam: General: Well-appearing, no acute distress HENT: [place], AT Eyes: EOMI, no scleral icterus Respiratory: Clear to auscultation bilaterally.  No crackles, wheezing or rales Cardiovascular: RRR, -M/R/G, no JVD GI: BS+, soft, nontender Extremities:-Edema,-tenderness Neuro: AAO x4, CNII-XII grossly intact Skin: Intact, no rashes or bruising Psych: Normal mood, normal affect  Data Reviewed:  Imaging: CXR 03/08/11 - Normal CXR. No infiltrate, effusion or edema  PFT: None on file  Labs: CBC    Component Value Date/Time   WBC 11.7 (H) 03/10/2018 1209   RBC 4.17 03/10/2018 1209   HGB 11.9 (L) 03/10/2018 1209   HCT 37.2 03/10/2018 1209   PLT 123 (L) 03/10/2018 1209   MCV 89.2 03/10/2018 1209   MCH 28.5 03/10/2018 1209   MCHC 32.0 03/10/2018 1209   RDW 15.7 (H) 03/10/2018 1209   LYMPHSABS 2.4 01/21/2014 1545   MONOABS 0.7 01/21/2014 1545   EOSABS 0.3 01/21/2014 1545   BASOSABS 0.0 01/21/2014 1545   BMET    Component Value Date/Time   NA 137 03/10/2018 1209   K 4.4 03/10/2018 1209   CL 105 03/10/2018 1209   CO2 23 03/10/2018 1209   GLUCOSE 93 03/10/2018 1209   BUN <5 (L) 03/10/2018 1209   CREATININE 0.85 03/10/2018 1209   CALCIUM 9.0 03/10/2018 1209    GFRNONAA >60 03/10/2018 1209   GFRAA >60 03/10/2018 1209   Imaging, labs and tests noted above have been reviewed independently by me.    Assessment & Plan:   Discussion: 23 year old female with hx of childhood asthma who presents for asthma and recent mold exposures. Triggered by allergens suggestive allergic type asthma.  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 If  you symptoms do not improve in 24-48 hours, please our office for evaluation and/or prednisone taper.  Health Maintenance  There is no immunization history on file for this patient.   Orders Placed This Encounter  Procedures  . CBC w/Diff    Standing Status:   Future    Standing Expiration Date:   10/29/2020  . IgE    Standing Status:   Future    Standing Expiration Date:   10/29/2020   Meds ordered this encounter  Medications  . fluticasone furoate-vilanterol (BREO ELLIPTA) 200-25 MCG/INH AEPB    Sig: Inhale 1 puff into the lungs daily.    Dispense:  28 each    Refill:  0  . montelukast (SINGULAIR) 10 MG tablet    Sig: Take 1 tablet (10 mg total) by mouth at bedtime.    Dispense:  30 tablet    Refill:  3   Return in about 3 months (around 01/30/2020).  Lafe Clerk Mechele Collin, MD [facility] 10/30/2019 10:21 AM  Office Number [phone]

## 2019-10-30 NOTE — Addendum Note (Signed)
Addended by: Lia Foyer R on: 10/30/2019 10:40 AM   Modules accepted: Orders

## 2019-10-30 NOTE — Patient Instructions (Addendum)
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 If you symptoms do not improve in 24-48 hours, please our office for evaluation and/or prednisone taper removal  Follow-up with me in 3 months

## 2019-10-31 LAB — IGE: IgE (Immunoglobulin E), Serum: 334 kU/L — ABNORMAL HIGH (ref <=114)

## 2019-11-03 DIAGNOSIS — J4530 Mild persistent asthma, uncomplicated: Secondary | ICD-10-CM

## 2019-11-12 NOTE — Telephone Encounter (Signed)
Dr Loanne Drilling, this pt is interested in doing further lab testing to figure out what she is allergic to. She checked with her insurance and they do cover 85% cost on the rast profile. Do you want to go ahead and order this for her. Please advise thanks!

## 2019-11-17 IMAGING — US US ABDOMEN LIMITED
1 series · 14 of 25 positions shown · non-contrast
Comparison: None.

CLINICAL DATA: Right upper quadrant pain with elevated liver
enzymes

EXAM:
ULTRASOUND ABDOMEN LIMITED RIGHT UPPER QUADRANT

[Series 1: us abdomen limited · 0.26mm/px · 14 of 26 slices shown]
[im 1/26]
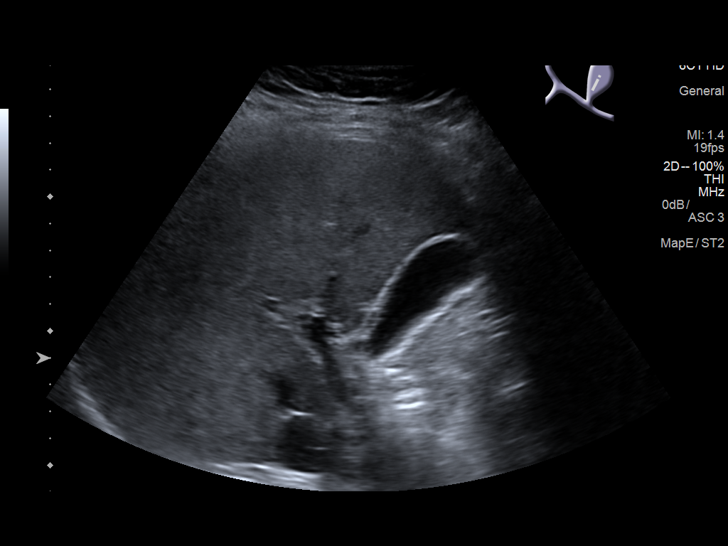
[im 3/26]
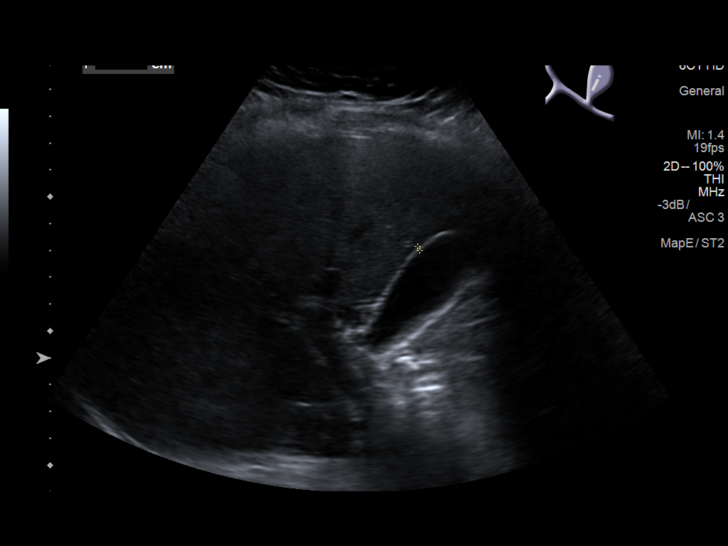
[im 5/26]
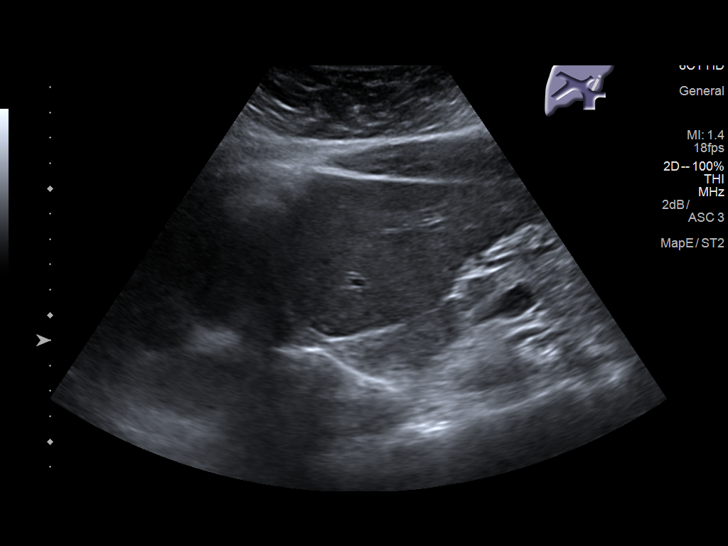
[im 7/26]
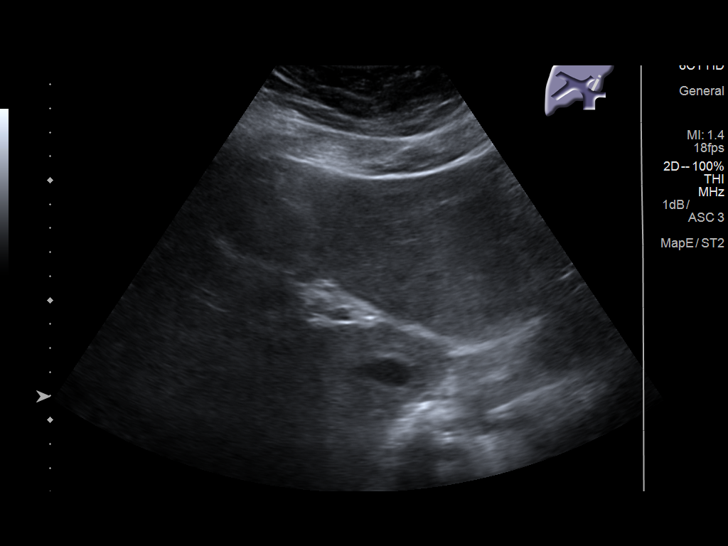
[im 9/26]
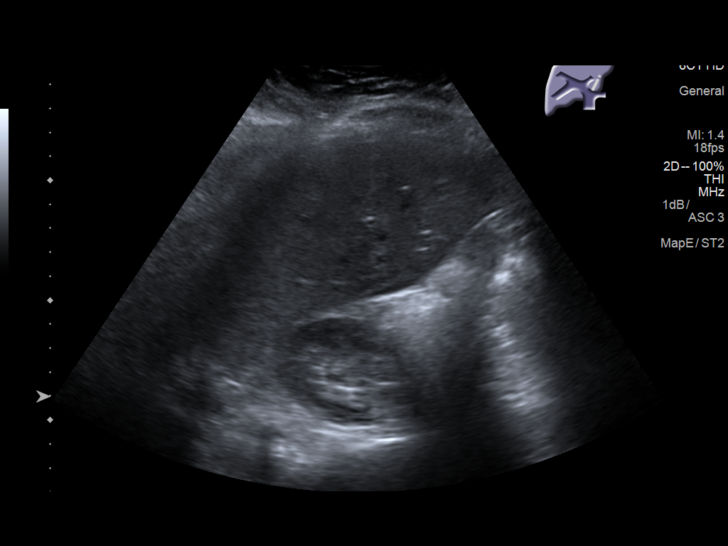
[im 10/26]
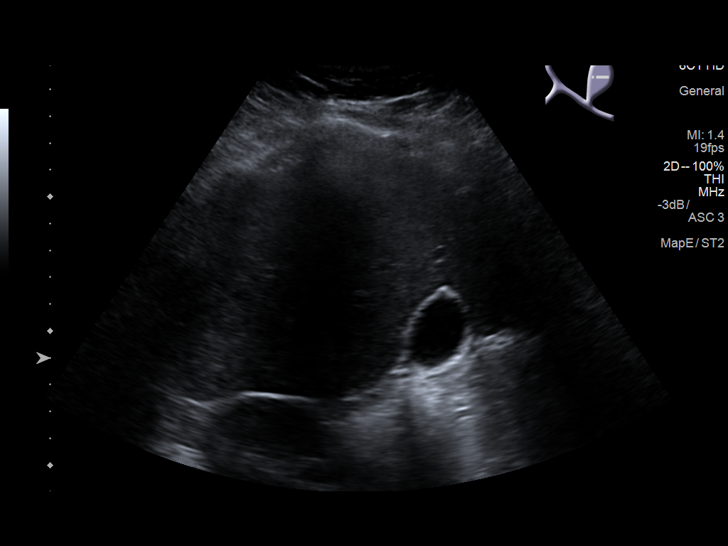
[im 12/26]
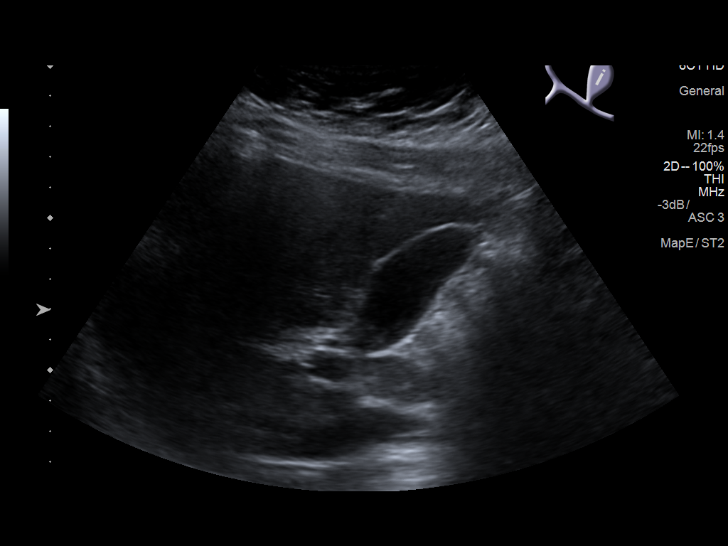
[im 14/26]
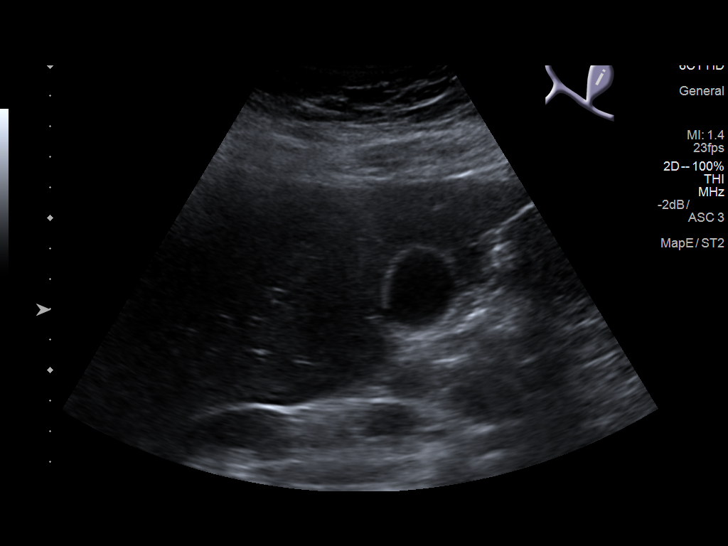
[im 16/26]
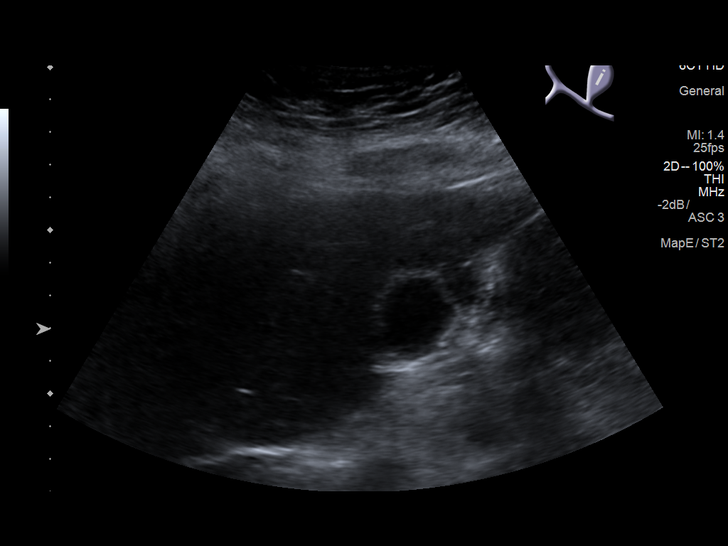
[im 17/26]
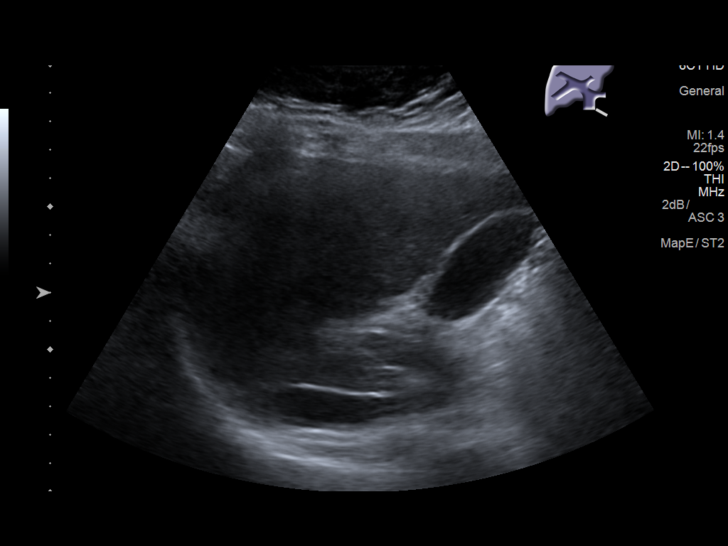
[im 19/26]
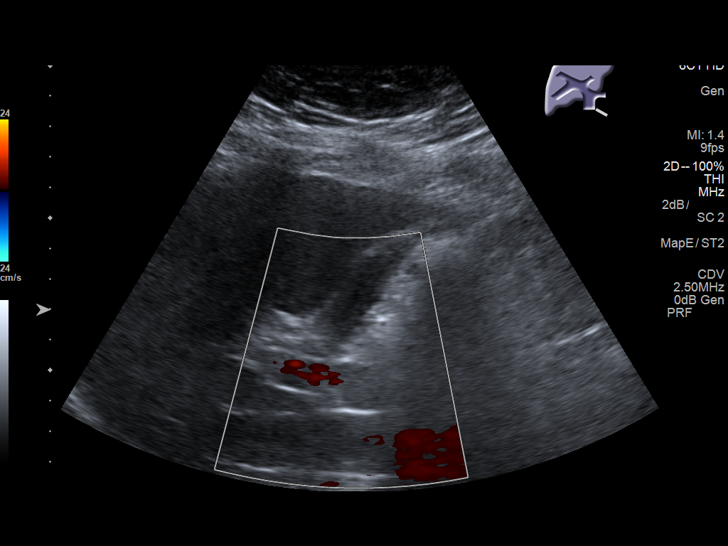
[im 21/26]
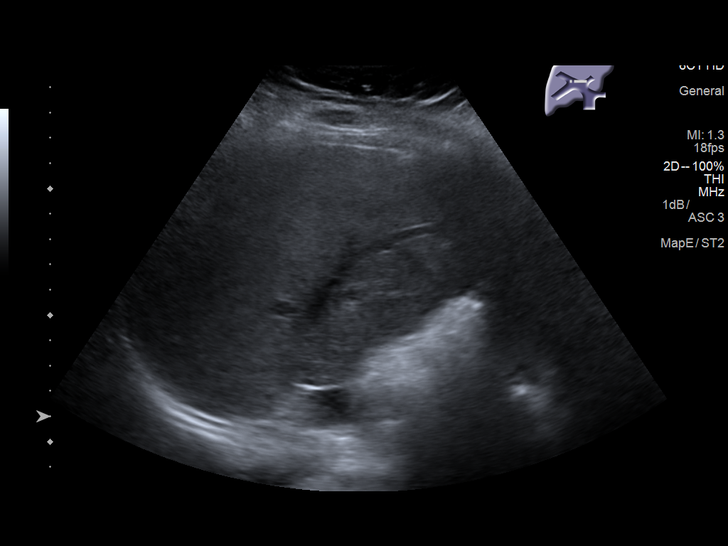
[im 23/26]
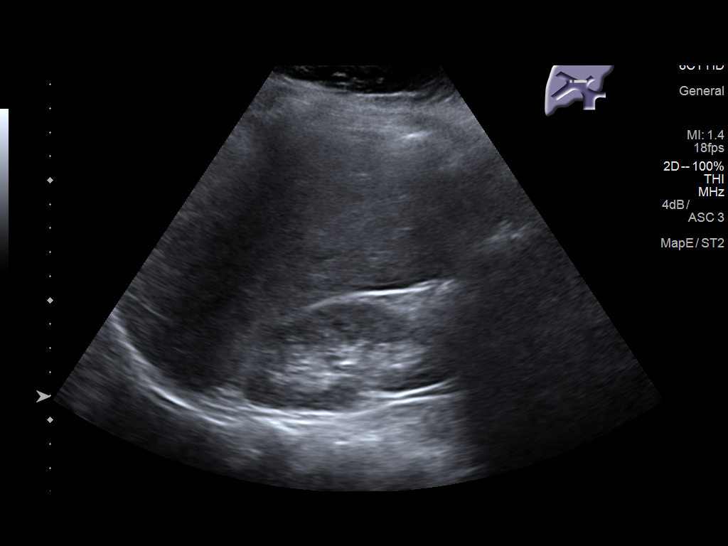
[im 26/26]
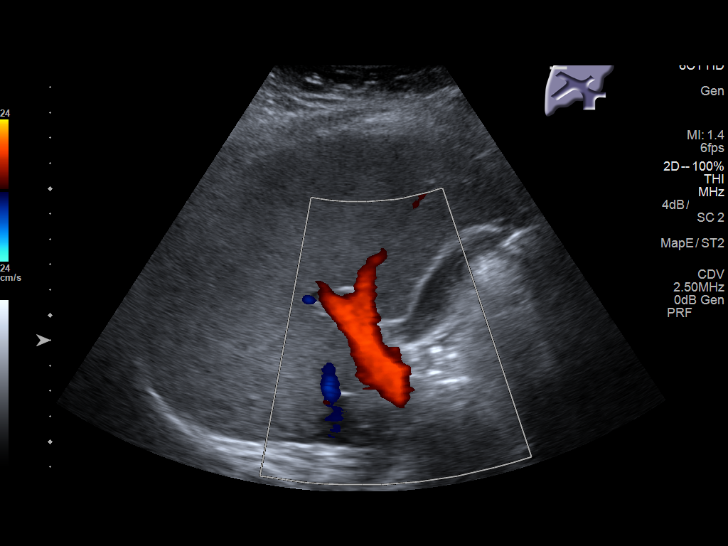

[14 of 25 positions shown; findings below may reference images not displayed]

FINDINGS: Gallbladder:

No gallstones or wall thickening visualized. There is no
pericholecystic fluid. No sonographic Murphy sign noted by
sonographer.

Common bile duct:

Diameter: 2 mm. No intrahepatic or extrahepatic biliary duct
dilatation.

Liver:

No focal lesion identified. Within normal limits in parenchymal
echogenicity. Portal vein is patent on color Doppler imaging with
normal direction of blood flow towards the liver.
IMPRESSION: Study within normal limits.

## 2019-11-30 DIAGNOSIS — F419 Anxiety disorder, unspecified: Secondary | ICD-10-CM | POA: Diagnosis not present

## 2019-11-30 DIAGNOSIS — K219 Gastro-esophageal reflux disease without esophagitis: Secondary | ICD-10-CM | POA: Diagnosis not present

## 2019-11-30 DIAGNOSIS — J309 Allergic rhinitis, unspecified: Secondary | ICD-10-CM | POA: Diagnosis not present

## 2019-11-30 DIAGNOSIS — J4521 Mild intermittent asthma with (acute) exacerbation: Secondary | ICD-10-CM | POA: Diagnosis not present

## 2019-12-14 NOTE — L&D Delivery Note (Signed)
Delivery Note At 12:43 AM a healthy female was delivered via Vaginal, Spontaneous (Presentation: Middle Occiput Anterior).  APGAR: 8, 9; weight  .   Placenta status: Spontaneous;Pathology, Intact.  Cord: 3 vessels with the following complications: Brisk bleeding and uterine atony.  Bimanual massage performed.  Methergine 0.2mg  IV x 1, TXA 1g, and Cytotec per rectum were administered  Cord pH: Unable to obtain  Anesthesia: Epidural Episiotomy: None Lacerations: Sulcus;Vaginal (bilateral) Suture Repair: 3.0 Vicryl Est. Blood Loss (mL):  700cc  Mom to postpartum.  Baby to Couplet care / Skin to Skin.  Steva Ready 09/11/2020, 1:13 AM

## 2019-12-27 ENCOUNTER — Ambulatory Visit: Payer: Federal, State, Local not specified - PPO | Admitting: Allergy & Immunology

## 2020-01-14 ENCOUNTER — Ambulatory Visit: Payer: Federal, State, Local not specified - PPO | Admitting: Allergy

## 2020-01-14 NOTE — Progress Notes (Deleted)
New Patient Note  RE: Martha Yu MRN: [id number] DOB: May 10, 1996 Date of Office Visit: 01/14/2020  Referring provider: Johny Blamer, MD Primary care provider: Johny Blamer, MD  Chief Complaint: No chief complaint on file.  History of Present Illness: I had the pleasure of seeing Martha Yu for initial evaluation at the [facility] on 01/14/2020. She is a 24 y.o. female, who is referred here by Johny Blamer, MD for the evaluation of chronic pruritus.  Rash started about *** ago. Mainly occurs on her ***. Describes them as ***. Individual rashes lasts about ***. No ecchymosis upon resolution. Associated symptoms include: ***. Suspected triggers are ***. Denies any *** fevers, chills, changes in medications, foods, personal care products or recent infections. She has tried the following therapies: *** with *** benefit. Systemic steroids ***. Currently on ***.  Previous work up includes: ***. Previous history of rash/hives: ***. Patient is up to date with the following cancer screening tests: ***.  Assessment and Plan: Martha Yu is a 24 y.o. female with: No problem-specific Assessment & Plan notes found for this encounter.  No follow-ups on file.  No orders of the defined types were placed in this encounter.  Lab Orders  No laboratory test(s) ordered today    Other allergy screening: Asthma: {Blank single:19197::"yes","no"} Rhino conjunctivitis: {Blank single:19197::"yes","no"} Food allergy: {Blank single:19197::"yes","no"} Medication allergy: {Blank single:19197::"yes","no"} Hymenoptera allergy: {Blank single:19197::"yes","no"} Urticaria: {Blank single:19197::"yes","no"} Eczema:{Blank single:19197::"yes","no"} History of recurrent infections suggestive of immunodeficency: {Blank single:19197::"yes","no"}  Diagnostics: Spirometry:  Tracings reviewed. Her effort: {Blank single:19197::"Good reproducible efforts.","It was hard to get  consistent efforts and there is a question as to whether this reflects a maximal maneuver.","Poor effort, data can not be interpreted."} FVC: ***L FEV1: ***L, ***% predicted FEV1/FVC ratio: ***% Interpretation: {Blank single:19197::"Spirometry consistent with mild obstructive disease","Spirometry consistent with moderate obstructive disease","Spirometry consistent with severe obstructive disease","Spirometry consistent with possible restrictive disease","Spirometry consistent with mixed obstructive and restrictive disease","Spirometry uninterpretable due to technique","Spirometry consistent with normal pattern","No overt abnormalities noted given today's efforts"}.  Please see scanned spirometry results for details.  Skin Testing: {Blank single:19197::"Select foods","Environmental allergy panel","Environmental allergy panel and select foods","Food allergy panel","None","Deferred due to recent antihistamines use"}. Positive test to: ***. Negative test to: ***.  Results discussed with patient/family.   Past Medical History: Patient Active Problem List   Diagnosis Date Noted  . Mild persistent asthma 10/30/2019  . H/O menorrhagia 12/15/2012  . H/O dysmenorrhea 12/15/2012  . Acne 12/15/2012  . Family history of diabetes mellitus 12/15/2011   Past Medical History:  Diagnosis Date  . Allergic rhinitis   . Asthma    Past Surgical History: Past Surgical History:  Procedure Laterality Date  . ARM SURGERY     RIGHT  . EYE SURGERY    . TYMPANOSTOMY TUBE PLACEMENT     Medication List:  Current Outpatient Medications  Medication Sig Dispense Refill  . albuterol (VENTOLIN HFA) 108 (90 Base) MCG/ACT inhaler INHALE 2 PUFFS EVERY 4 6 HOURS AS NEEDED    . BREO ELLIPTA 100-25 MCG/INH AEPB 1 puff daily.    . citalopram (CELEXA) 20 MG tablet Take 20 mg by mouth daily.    . fluticasone furoate-vilanterol (BREO ELLIPTA) 200-25 MCG/INH AEPB Inhale 1 puff into the lungs daily. 28 each 0  .  fluticasone furoate-vilanterol (BREO ELLIPTA) 200-25 MCG/INH AEPB Inhale 1 puff into the lungs daily. 60 each 6  . montelukast (SINGULAIR) 10 MG tablet Take 1 tablet (10 mg total) by mouth at bedtime. 30 tablet  3  . pantoprazole (PROTONIX) 40 MG tablet Take 40 mg by mouth daily.     No current facility-administered medications for this visit.   Allergies: Allergies  Allergen Reactions  . Penicillins Hives   Social History: Social History   Socioeconomic History  . Marital status: Soil scientist    Spouse name: Not on file  . Number of children: Not on file  . Years of education: Not on file  . Highest education level: Not on file  Occupational History  . Not on file  Tobacco Use  . Smoking status: Former Smoker    Years: 1.00    Start date: 2013    Quit date: 2014    Years since quitting: 7.0  . Smokeless tobacco: Never Used  . Tobacco comment: one cig a week  Substance and Sexual Activity  . Alcohol use: No  . Drug use: No  . Sexual activity: Never    Birth control/protection: Pill  Other Topics Concern  . Not on file  Social History Narrative  . Not on file   Social Determinants of Health   Financial Resource Strain:   . Difficulty of Paying Living Expenses: Not on file  Food Insecurity:   . Worried About Charity fundraiser in the Last Year: Not on file  . Ran Out of Food in the Last Year: Not on file  Transportation Needs:   . Lack of Transportation (Medical): Not on file  . Lack of Transportation (Non-Medical): Not on file  Physical Activity:   . Days of Exercise per Week: Not on file  . Minutes of Exercise per Session: Not on file  Stress:   . Feeling of Stress : Not on file  Social Connections:   . Frequency of Communication with Friends and Family: Not on file  . Frequency of Social Gatherings with Friends and Family: Not on file  . Attends Religious Services: Not on file  . Active Member of Clubs or Organizations: Not on file  . Attends Theatre manager Meetings: Not on file  . Marital Status: Not on file   Lives in a ***. Smoking: *** Occupation: ***  Environmental HistoryFreight forwarder in the house: Estate agent in the family room: {Blank single:19197::"yes","no"} Carpet in the bedroom: {Blank single:19197::"yes","no"} Heating: {Blank single:19197::"electric","gas"} Cooling: {Blank single:19197::"central","window"} Pet: {Blank single:19197::"yes ***","no"}  Family History: Family History  Problem Relation Age of Onset  . Diabetes Mother   . Cancer Mother        UTERINE  . Hypertension Father   . Breast cancer Maternal Aunt   . Breast cancer Maternal Grandmother   . Asthma Sister    Problem                               Relation Asthma                                   *** Eczema                                *** Food allergy                          *** Allergic rhino conjunctivitis     ***  Review of Systems  Constitutional: Negative for appetite change, chills, fever and unexpected weight change.  HENT: Negative for congestion and rhinorrhea.   Eyes: Negative for itching.  Respiratory: Negative for cough, chest tightness, shortness of breath and wheezing.   Cardiovascular: Negative for chest pain.  Gastrointestinal: Negative for abdominal pain.  Genitourinary: Negative for difficulty urinating.  Skin: Negative for rash.  Allergic/Immunologic: Negative for environmental allergies and food allergies.  Neurological: Negative for headaches.   Objective: There were no vitals taken for this visit. There is no height or weight on file to calculate BMI. Physical Exam  Constitutional: She is oriented to person, place, and time. She appears well-developed and well-nourished.  HENT:  Head: Normocephalic and atraumatic.  Right Ear: External ear normal.  Left Ear: External ear normal.  Nose: Nose normal.  Mouth/Throat: Oropharynx is clear and moist.  Eyes: Conjunctivae and  EOM are normal.  Cardiovascular: Normal rate, regular rhythm and normal heart sounds. Exam reveals no gallop and no friction rub.  No murmur heard. Pulmonary/Chest: Effort normal and breath sounds normal. She has no wheezes. She has no rales.  Abdominal: Soft.  Musculoskeletal:     Cervical back: Neck supple.  Neurological: She is alert and oriented to person, place, and time.  Skin: Skin is warm. No rash noted.  Psychiatric: She has a normal mood and affect. Her behavior is normal.  Nursing note and vitals reviewed.  The plan was reviewed with the patient/family, and all questions/concerned were addressed.  It was my pleasure to see Martha Yu today and participate in her care. Please feel free to contact me with any questions or concerns.  Sincerely,  Wyline Mood, DO Allergy & Immunology  [facility] office: [phone] [facility] office: [phone] [place] office: [phone]

## 2020-01-18 DIAGNOSIS — J0100 Acute maxillary sinusitis, unspecified: Secondary | ICD-10-CM | POA: Diagnosis not present

## 2020-01-29 NOTE — Progress Notes (Deleted)
 @  Patient ID: Martha Yu, female    DOB: 1996-04-05, 24 y.o.   MRN: [id number]  No chief complaint on file.   Referring provider: No ref. provider found  HPI: 24 year old female, former smoker (1 pack year hx). PMH significant for mild persistent asthma. Patient of Dr. Everardo All, seen for initial consult on 10/30/19, hx of childhood asthma and recent mold exposure. Breo increased to 200 one puff once daily and started on Singulair 10mg  at bedtime. Ordered for CBC with diff and IgE.   01/30/2020 Patient presents today for 3 months follow-up with PFTs.   Imaging: CXR 03/08/11 - Normal CXR. No infiltrate, effusion or edema  Allergies  Allergen Reactions  . Penicillins Hives     There is no immunization history on file for this patient.  Past Medical History:  Diagnosis Date  . Allergic rhinitis   . Asthma     Tobacco History: Social History   Tobacco Use  Smoking Status Former Smoker  . Years: 1.00  . Start date: 2013  . Quit date: 2014  . Years since quitting: 7.1  Smokeless Tobacco Never Used  Tobacco Comment   one cig a week   Counseling given: Not Answered Comment: one cig a week   Outpatient Medications Prior to Visit  Medication Sig Dispense Refill  . albuterol (VENTOLIN HFA) 108 (90 Base) MCG/ACT inhaler INHALE 2 PUFFS EVERY 4 6 HOURS AS NEEDED    . BREO ELLIPTA 100-25 MCG/INH AEPB 1 puff daily.    . citalopram (CELEXA) 20 MG tablet Take 20 mg by mouth daily.    . fluticasone furoate-vilanterol (BREO ELLIPTA) 200-25 MCG/INH AEPB Inhale 1 puff into the lungs daily. 28 each 0  . fluticasone furoate-vilanterol (BREO ELLIPTA) 200-25 MCG/INH AEPB Inhale 1 puff into the lungs daily. 60 each 6  . montelukast (SINGULAIR) 10 MG tablet Take 1 tablet (10 mg total) by mouth at bedtime. 30 tablet 3  . pantoprazole (PROTONIX) 40 MG tablet Take 40 mg by mouth daily.     No facility-administered medications prior to visit.      Review of Systems  Review of  Systems   Physical Exam  There were no vitals taken for this visit. Physical Exam   Lab Results:  CBC    Component Value Date/Time   WBC 8.3 10/30/2019 1027   RBC 4.52 10/30/2019 1027   HGB 12.1 10/30/2019 1027   HCT 37.4 10/30/2019 1027   PLT 271.0 10/30/2019 1027   MCV 82.8 10/30/2019 1027   MCH 28.5 03/10/2018 1209   MCHC 32.3 10/30/2019 1027   RDW 15.4 10/30/2019 1027   LYMPHSABS 2.9 10/30/2019 1027   MONOABS 0.6 10/30/2019 1027   EOSABS 0.4 10/30/2019 1027   BASOSABS 0.0 10/30/2019 1027    BMET    Component Value Date/Time   NA 137 03/10/2018 1209   K 4.4 03/10/2018 1209   CL 105 03/10/2018 1209   CO2 23 03/10/2018 1209   GLUCOSE 93 03/10/2018 1209   BUN <5 (L) 03/10/2018 1209   CREATININE 0.85 03/10/2018 1209   CALCIUM 9.0 03/10/2018 1209   GFRNONAA >60 03/10/2018 1209   GFRAA >60 03/10/2018 1209    BNP No results found for: BNP  ProBNP No results found for: PROBNP  Imaging: No results found.   Assessment & Plan:   No problem-specific Assessment & Plan notes found for this encounter.     03/12/2018, NP 01/29/2020

## 2020-01-30 ENCOUNTER — Ambulatory Visit: Payer: Federal, State, Local not specified - PPO | Admitting: Primary Care

## 2020-02-13 DIAGNOSIS — Z3400 Encounter for supervision of normal first pregnancy, unspecified trimester: Secondary | ICD-10-CM | POA: Diagnosis not present

## 2020-02-13 DIAGNOSIS — Z3201 Encounter for pregnancy test, result positive: Secondary | ICD-10-CM | POA: Diagnosis not present

## 2020-02-13 DIAGNOSIS — Z3687 Encounter for antenatal screening for uncertain dates: Secondary | ICD-10-CM | POA: Diagnosis not present

## 2020-02-13 LAB — OB RESULTS CONSOLE HEPATITIS B SURFACE ANTIGEN: Hepatitis B Surface Ag: NEGATIVE

## 2020-02-13 LAB — OB RESULTS CONSOLE HIV ANTIBODY (ROUTINE TESTING): HIV: NONREACTIVE

## 2020-02-13 LAB — OB RESULTS CONSOLE RUBELLA ANTIBODY, IGM
Rubella: IMMUNE
Rubella: IMMUNE

## 2020-04-08 DIAGNOSIS — Z3402 Encounter for supervision of normal first pregnancy, second trimester: Secondary | ICD-10-CM | POA: Diagnosis not present

## 2020-04-08 LAB — OB RESULTS CONSOLE GC/CHLAMYDIA
Chlamydia: NEGATIVE
Gonorrhea: NEGATIVE

## 2020-04-11 ENCOUNTER — Other Ambulatory Visit: Payer: Self-pay

## 2020-04-30 DIAGNOSIS — Z36 Encounter for antenatal screening for chromosomal anomalies: Secondary | ICD-10-CM | POA: Diagnosis not present

## 2020-05-07 ENCOUNTER — Other Ambulatory Visit: Payer: Self-pay | Admitting: Obstetrics & Gynecology

## 2020-05-07 DIAGNOSIS — O99210 Obesity complicating pregnancy, unspecified trimester: Secondary | ICD-10-CM

## 2020-05-26 ENCOUNTER — Encounter: Payer: Self-pay | Admitting: *Deleted

## 2020-05-28 ENCOUNTER — Ambulatory Visit: Payer: Federal, State, Local not specified - PPO | Attending: Obstetrics and Gynecology

## 2020-05-28 ENCOUNTER — Other Ambulatory Visit: Payer: Self-pay

## 2020-05-28 ENCOUNTER — Ambulatory Visit: Payer: Federal, State, Local not specified - PPO | Admitting: *Deleted

## 2020-05-28 VITALS — BP 119/66 | HR 90

## 2020-05-28 DIAGNOSIS — O99210 Obesity complicating pregnancy, unspecified trimester: Secondary | ICD-10-CM | POA: Diagnosis not present

## 2020-05-28 DIAGNOSIS — O99212 Obesity complicating pregnancy, second trimester: Secondary | ICD-10-CM | POA: Diagnosis not present

## 2020-05-28 DIAGNOSIS — Z3A24 24 weeks gestation of pregnancy: Secondary | ICD-10-CM

## 2020-05-28 DIAGNOSIS — E669 Obesity, unspecified: Secondary | ICD-10-CM | POA: Diagnosis not present

## 2020-05-28 DIAGNOSIS — Z363 Encounter for antenatal screening for malformations: Secondary | ICD-10-CM

## 2020-05-28 DIAGNOSIS — O0990 Supervision of high risk pregnancy, unspecified, unspecified trimester: Secondary | ICD-10-CM | POA: Insufficient documentation

## 2020-06-18 DIAGNOSIS — Z3482 Encounter for supervision of other normal pregnancy, second trimester: Secondary | ICD-10-CM | POA: Diagnosis not present

## 2020-06-18 LAB — OB RESULTS CONSOLE RPR: RPR: NONREACTIVE

## 2020-07-14 ENCOUNTER — Other Ambulatory Visit: Payer: Self-pay | Admitting: Pulmonary Disease

## 2020-07-15 DIAGNOSIS — Z23 Encounter for immunization: Secondary | ICD-10-CM | POA: Diagnosis not present

## 2020-08-09 ENCOUNTER — Other Ambulatory Visit: Payer: Self-pay | Admitting: Pulmonary Disease

## 2020-08-15 DIAGNOSIS — Z3483 Encounter for supervision of other normal pregnancy, third trimester: Secondary | ICD-10-CM | POA: Diagnosis not present

## 2020-08-22 DIAGNOSIS — O26843 Uterine size-date discrepancy, third trimester: Secondary | ICD-10-CM | POA: Diagnosis not present

## 2020-09-02 ENCOUNTER — Inpatient Hospital Stay (HOSPITAL_COMMUNITY)
Admission: AD | Admit: 2020-09-02 | Discharge: 2020-09-02 | Disposition: A | Discharge status: Home / Self Care | Payer: Federal, State, Local not specified - PPO | Attending: Obstetrics & Gynecology | Admitting: Obstetrics & Gynecology

## 2020-09-02 ENCOUNTER — Other Ambulatory Visit: Payer: Self-pay

## 2020-09-02 ENCOUNTER — Encounter (HOSPITAL_COMMUNITY): Payer: Self-pay | Admitting: Obstetrics & Gynecology

## 2020-09-02 DIAGNOSIS — O99513 Diseases of the respiratory system complicating pregnancy, third trimester: Secondary | ICD-10-CM | POA: Insufficient documentation

## 2020-09-02 DIAGNOSIS — Z87891 Personal history of nicotine dependence: Secondary | ICD-10-CM | POA: Diagnosis not present

## 2020-09-02 DIAGNOSIS — O99891 Other specified diseases and conditions complicating pregnancy: Secondary | ICD-10-CM

## 2020-09-02 DIAGNOSIS — O26893 Other specified pregnancy related conditions, third trimester: Secondary | ICD-10-CM | POA: Insufficient documentation

## 2020-09-02 DIAGNOSIS — R030 Elevated blood-pressure reading, without diagnosis of hypertension: Secondary | ICD-10-CM | POA: Diagnosis not present

## 2020-09-02 DIAGNOSIS — O99613 Diseases of the digestive system complicating pregnancy, third trimester: Secondary | ICD-10-CM | POA: Diagnosis not present

## 2020-09-02 DIAGNOSIS — Z3A38 38 weeks gestation of pregnancy: Secondary | ICD-10-CM | POA: Insufficient documentation

## 2020-09-02 DIAGNOSIS — F419 Anxiety disorder, unspecified: Secondary | ICD-10-CM | POA: Insufficient documentation

## 2020-09-02 DIAGNOSIS — J45909 Unspecified asthma, uncomplicated: Secondary | ICD-10-CM | POA: Diagnosis not present

## 2020-09-02 DIAGNOSIS — O163 Unspecified maternal hypertension, third trimester: Secondary | ICD-10-CM

## 2020-09-02 DIAGNOSIS — O99343 Other mental disorders complicating pregnancy, third trimester: Secondary | ICD-10-CM | POA: Insufficient documentation

## 2020-09-02 DIAGNOSIS — K219 Gastro-esophageal reflux disease without esophagitis: Secondary | ICD-10-CM | POA: Insufficient documentation

## 2020-09-02 DIAGNOSIS — Z3689 Encounter for other specified antenatal screening: Secondary | ICD-10-CM

## 2020-09-02 LAB — COMPREHENSIVE METABOLIC PANEL
ALT: 15 U/L (ref 0–44)
AST: 17 U/L (ref 15–41)
Albumin: 2.5 g/dL — ABNORMAL LOW (ref 3.5–5.0)
Alkaline Phosphatase: 111 U/L (ref 38–126)
Anion gap: 11 (ref 5–15)
BUN: <5 mg/dL — ABNORMAL LOW (ref 6–20)
CO2: 20 mmol/L — ABNORMAL LOW (ref 22–32)
Calcium: 8.4 mg/dL — ABNORMAL LOW (ref 8.9–10.3)
Chloride: 108 mmol/L (ref 98–111)
Creatinine, Ser: 0.65 mg/dL (ref 0.44–1.00)
GFR calc Af Amer: >60 mL/min (ref >60)
GFR calc non Af Amer: >60 mL/min (ref >60)
Glucose, Bld: 78 mg/dL (ref 70–99)
Potassium: 3 mmol/L — ABNORMAL LOW (ref 3.5–5.1)
Sodium: 139 mmol/L (ref 135–145)
Total Bilirubin: 0.5 mg/dL (ref 0.3–1.2)
Total Protein: 5.5 g/dL — ABNORMAL LOW (ref 6.5–8.1)

## 2020-09-02 LAB — CBC
HCT: 35 % — ABNORMAL LOW (ref 36.0–46.0)
Hemoglobin: 11.4 g/dL — ABNORMAL LOW (ref 12.0–15.0)
MCH: 30.7 pg (ref 26.0–34.0)
MCHC: 32.6 g/dL (ref 30.0–36.0)
MCV: 94.3 fL (ref 80.0–100.0)
Platelets: 197 10*3/uL (ref 150–400)
RBC: 3.71 MIL/uL — ABNORMAL LOW (ref 3.87–5.11)
RDW: 19.4 % — ABNORMAL HIGH (ref 11.5–15.5)
WBC: 10 10*3/uL (ref 4.0–10.5)
nRBC: 0 % (ref 0.0–0.2)

## 2020-09-02 LAB — URINALYSIS, ROUTINE W REFLEX MICROSCOPIC
Bilirubin Urine: NEGATIVE
Glucose, UA: NEGATIVE mg/dL
Hgb urine dipstick: NEGATIVE
Ketones, ur: NEGATIVE mg/dL
Leukocytes,Ua: NEGATIVE
Nitrite: NEGATIVE
Protein, ur: NEGATIVE mg/dL
Specific Gravity, Urine: 1.01 (ref 1.005–1.030)
pH: 7 (ref 5.0–8.0)

## 2020-09-02 LAB — PROTEIN / CREATININE RATIO, URINE
Creatinine, Urine: 91.71 mg/dL
Protein Creatinine Ratio: 0.21 mg/mg{Cre} — ABNORMAL HIGH (ref 0.00–0.15)
Total Protein, Urine: 19 mg/dL

## 2020-09-02 NOTE — MAU Provider Note (Signed)
Chief Complaint:  fetal monitoring   First Provider Initiated Contact with Patient 09/02/20 1322     HPI: Martha Yu is a 24 y.o. G1P0 at [redacted]w[redacted]d who was sent to MAU by her OB provider for fetal monitoring after difficulty finding heart tones in the office. Pt endorses fetal movement without decrease. Denies vaginal bleeding, leaking of fluid, decreased fetal movement, fever, falls, or recent illness.   Pregnancy Course: Normal although pt states they always have trouble finding FHT.  Past Medical History:  Diagnosis Date   Allergic rhinitis    Anxiety    Asthma    GERD (gastroesophageal reflux disease)    Headache    Migraines   OB History  Gravida Para Term Preterm AB Living  1         0  SAB TAB Ectopic Multiple Live Births               # Outcome Date GA Lbr Len/2nd Weight Sex Delivery Anes PTL Lv  1 Current            Past Surgical History:  Procedure Laterality Date   ARM SURGERY     RIGHT   EYE SURGERY     TYMPANOSTOMY TUBE PLACEMENT     Family History  Problem Relation Age of Onset   Diabetes Mother    Cancer Mother        UTERINE   Hypertension Father    Breast cancer Maternal Aunt    Breast cancer Maternal Grandmother    Asthma Sister    Social History   Tobacco Use   Smoking status: Former Smoker    Years: 1.00    Start date: 2013    Quit date: 2014    Years since quitting: 7.7   Smokeless tobacco: Never Used   Tobacco comment: one cig a week  Vaping Use   Vaping Use: Never used  Substance Use Topics   Alcohol use: No   Drug use: No   Allergies  Allergen Reactions   Penicillins Hives   No medications prior to admission.    I have reviewed patient's Past Medical Hx, Surgical Hx, Family Hx, Social Hx, medications and allergies.   ROS:  Review of Systems  Constitutional: Negative for fatigue.  Eyes: Negative for visual disturbance.  Gastrointestinal: Positive for nausea (mild). Negative for abdominal pain,  constipation and vomiting.  Genitourinary: Negative for pelvic pain, vaginal bleeding, vaginal discharge and vaginal pain.  Musculoskeletal: Negative for back pain.  Neurological: Negative for dizziness and headaches.  All other systems reviewed and are negative.   Physical Exam   Patient Vitals for the past 24 hrs:  BP Temp Pulse Resp SpO2 Height  09/02/20 1500 134/77 -- 96 -- -- --  09/02/20 1446 133/70 -- 97 -- -- --  09/02/20 1431 139/79 -- 99 -- -- --  09/02/20 1416 136/78 -- 95 -- -- --  09/02/20 1400 139/80 -- 98 -- -- --  09/02/20 1346 (!) 143/84 -- (!) 101 -- -- --  09/02/20 1331 137/79 -- 99 -- -- --  09/02/20 1320 132/81 -- (!) 110 -- -- --  09/02/20 1310 -- -- -- -- 100 % --  09/02/20 1305 -- -- -- -- 100 % --  09/02/20 1301 (!) 152/96 -- (!) 106 -- -- --  09/02/20 1300 -- -- -- -- 100 % --  09/02/20 1250 (!) 152/88 98.6 F (37 C) (!) 108 18 100 % 5\' 3"  (1.6 m)  Constitutional: Well-developed, well-nourished female in no acute distress.  Cardiovascular: normal rate & rhythm, no murmur Respiratory: normal effort, lung sounds clear throughout GI: Abd soft, non-tender, gravid appropriate for gestational age. Pos BS x 4 MS: Extremities nontender, bilateral +1 pitting edema in calves, normal ROM Neurologic: Alert and oriented x 4.  Pelvic exam deferred   Fetal Tracing: reactive Baseline: 145 Variability: moderate Accelerations: present Decelerations: absent Toco: none   Labs: Results for orders placed or performed during the hospital encounter of 09/02/20 (from the past 24 hour(s))  Urinalysis, Routine w reflex microscopic     Status: Abnormal   Collection Time: 09/02/20  1:03 PM  Result Value Ref Range   Color, Urine YELLOW YELLOW   APPearance HAZY (A) CLEAR   Specific Gravity, Urine 1.010 1.005 - 1.030   pH 7.0 5.0 - 8.0   Glucose, UA NEGATIVE NEGATIVE mg/dL   Hgb urine dipstick NEGATIVE NEGATIVE   Bilirubin Urine NEGATIVE NEGATIVE   Ketones, ur  NEGATIVE NEGATIVE mg/dL   Protein, ur NEGATIVE NEGATIVE mg/dL   Nitrite NEGATIVE NEGATIVE   Leukocytes,Ua NEGATIVE NEGATIVE  Protein / creatinine ratio, urine     Status: Abnormal   Collection Time: 09/02/20  1:14 PM  Result Value Ref Range   Creatinine, Urine 91.71 mg/dL   Total Protein, Urine 19 mg/dL   Protein Creatinine Ratio 0.21 (H) 0.00 - 0.15 mg/mg[Cre]  CBC     Status: Abnormal   Collection Time: 09/02/20  1:24 PM  Result Value Ref Range   WBC 10.0 4.0 - 10.5 K/uL   RBC 3.71 (L) 3.87 - 5.11 MIL/uL   Hemoglobin 11.4 (L) 12.0 - 15.0 g/dL   HCT 62.6 (L) 36 - 46 %   MCV 94.3 80.0 - 100.0 fL   MCH 30.7 26.0 - 34.0 pg   MCHC 32.6 30.0 - 36.0 g/dL   RDW 94.8 (H) 54.6 - 27.0 %   Platelets 197 150 - 400 K/uL   nRBC 0.0 0.0 - 0.2 %  Comprehensive metabolic panel     Status: Abnormal   Collection Time: 09/02/20  1:24 PM  Result Value Ref Range   Sodium 139 135 - 145 mmol/L   Potassium 3.0 (L) 3.5 - 5.1 mmol/L   Chloride 108 98 - 111 mmol/L   CO2 20 (L) 22 - 32 mmol/L   Glucose, Bld 78 70 - 99 mg/dL   BUN <5 (L) 6 - 20 mg/dL   Creatinine, Ser 3.50 0.44 - 1.00 mg/dL   Calcium 8.4 (L) 8.9 - 10.3 mg/dL   Total Protein 5.5 (L) 6.5 - 8.1 g/dL   Albumin 2.5 (L) 3.5 - 5.0 g/dL   AST 17 15 - 41 U/L   ALT 15 0 - 44 U/L   Alkaline Phosphatase 111 38 - 126 U/L   Total Bilirubin 0.5 0.3 - 1.2 mg/dL   GFR calc non Af Amer >60 >60 mL/min   GFR calc Af Amer >60 >60 mL/min   Anion gap 11 5 - 15    Imaging:  No results found.  MAU Course: Orders Placed This Encounter  Procedures   Urinalysis, Routine w reflex microscopic   Protein / creatinine ratio, urine   CBC   Comprehensive metabolic panel   Discharge patient   MDM: Reactive NST but elevated BP on admission to MAU Pre-eclampsia workup negative Community MD (Dr. Charlotta Newton) made aware of elevated BP without preeclampsia, agreed pt needs to follow up in office for BP check in 2 days  Assessment: 1. Elevated blood pressure  affecting pregnancy in third trimester, antepartum   2. [redacted] weeks gestation of pregnancy   3. NST (non-stress test) reactive    Plan: Discharge home in stable condition with term labor precautions.    Follow-up Information    Gynecology, [facility] Schedule an appointment as soon as possible for a visit in 2 day(s).   Specialty: Obstetrics and Gynecology Why: Call to make an appointment for follow up blood pressure check in two days Contact information: [address] [place] [address] [phone]               Allergies as of 09/02/2020      Reactions   Penicillins Hives      Medication List    STOP taking these medications   montelukast 10 MG tablet Commonly known as: SINGULAIR     TAKE these medications   albuterol 108 (90 Base) MCG/ACT inhaler Commonly known as: VENTOLIN HFA INHALE 2 PUFFS EVERY 4 6 HOURS AS NEEDED   Breo Ellipta 200-25 MCG/INH Aepb Generic drug: fluticasone furoate-vilanterol Inhale 1 puff into the lungs daily. What changed: Another medication with the same name was removed. Continue taking this medication, and follow the directions you see here.   Breo Ellipta 200-25 MCG/INH Aepb Generic drug: fluticasone furoate-vilanterol TAKE 1 PUFF BY MOUTH EVERY DAY What changed: Another medication with the same name was removed. Continue taking this medication, and follow the directions you see here.   buPROPion 150 MG 24 hr tablet Commonly known as: WELLBUTRIN XL Take 150 mg by mouth daily.   citalopram 20 MG tablet Commonly known as: CELEXA Take 20 mg by mouth daily.   CLARITIN PO Take by mouth.   pantoprazole 40 MG tablet Commonly known as: PROTONIX Take 40 mg by mouth daily.   PRENATAL GUMMIES/DHA & FA PO Take by mouth.      Edd Arbour, CNM, MSN, [facility] 09/02/20 4:03 PM

## 2020-09-02 NOTE — Discharge Instructions (Signed)

## 2020-09-02 NOTE — MAU Note (Signed)
.   Martha Yu is a 24 y.o. at [redacted]w[redacted]d here in MAU reporting: she was sent from the office for Fetal monitoring. Denies any VB or LOF. +FM  Onset of complaint: Pain score: 0 Vitals:   09/02/20 1250  BP: (!) 152/88  Pulse: (!) 108  Resp: 18  Temp: 98.6 F (37 C)     FHT:145 Lab orders placed from triage: UA

## 2020-09-09 ENCOUNTER — Other Ambulatory Visit: Payer: Self-pay

## 2020-09-09 ENCOUNTER — Encounter (HOSPITAL_COMMUNITY): Payer: Self-pay | Admitting: Obstetrics & Gynecology

## 2020-09-09 ENCOUNTER — Inpatient Hospital Stay (HOSPITAL_COMMUNITY)
Admission: AD | Admit: 2020-09-09 | Discharge: 2020-09-13 | DRG: 806 | Disposition: A | Discharge status: Home / Self Care | Payer: Federal, State, Local not specified - PPO | Attending: Obstetrics & Gynecology | Admitting: Obstetrics & Gynecology

## 2020-09-09 ENCOUNTER — Other Ambulatory Visit: Payer: Self-pay | Admitting: Obstetrics & Gynecology

## 2020-09-09 DIAGNOSIS — F419 Anxiety disorder, unspecified: Secondary | ICD-10-CM | POA: Diagnosis present

## 2020-09-09 DIAGNOSIS — D62 Acute posthemorrhagic anemia: Secondary | ICD-10-CM | POA: Diagnosis not present

## 2020-09-09 DIAGNOSIS — Z20822 Contact with and (suspected) exposure to covid-19: Secondary | ICD-10-CM | POA: Diagnosis not present

## 2020-09-09 DIAGNOSIS — J4530 Mild persistent asthma, uncomplicated: Secondary | ICD-10-CM | POA: Diagnosis not present

## 2020-09-09 DIAGNOSIS — O139 Gestational [pregnancy-induced] hypertension without significant proteinuria, unspecified trimester: Secondary | ICD-10-CM | POA: Diagnosis present

## 2020-09-09 DIAGNOSIS — O9952 Diseases of the respiratory system complicating childbirth: Secondary | ICD-10-CM | POA: Diagnosis present

## 2020-09-09 DIAGNOSIS — O134 Gestational [pregnancy-induced] hypertension without significant proteinuria, complicating childbirth: Secondary | ICD-10-CM | POA: Diagnosis not present

## 2020-09-09 DIAGNOSIS — O133 Gestational [pregnancy-induced] hypertension without significant proteinuria, third trimester: Secondary | ICD-10-CM

## 2020-09-09 DIAGNOSIS — O864 Pyrexia of unknown origin following delivery: Secondary | ICD-10-CM | POA: Diagnosis not present

## 2020-09-09 DIAGNOSIS — Z3A39 39 weeks gestation of pregnancy: Secondary | ICD-10-CM | POA: Diagnosis not present

## 2020-09-09 DIAGNOSIS — Z880 Allergy status to penicillin: Secondary | ICD-10-CM | POA: Diagnosis not present

## 2020-09-09 DIAGNOSIS — J45909 Unspecified asthma, uncomplicated: Secondary | ICD-10-CM | POA: Diagnosis present

## 2020-09-09 DIAGNOSIS — O9081 Anemia of the puerperium: Secondary | ICD-10-CM | POA: Diagnosis not present

## 2020-09-09 DIAGNOSIS — O99214 Obesity complicating childbirth: Secondary | ICD-10-CM | POA: Diagnosis present

## 2020-09-09 DIAGNOSIS — O99344 Other mental disorders complicating childbirth: Secondary | ICD-10-CM | POA: Diagnosis not present

## 2020-09-09 LAB — PROTEIN / CREATININE RATIO, URINE
Creatinine, Urine: 197.28 mg/dL
Protein Creatinine Ratio: 0.16 mg/mg{Cre} — ABNORMAL HIGH (ref 0.00–0.15)
Total Protein, Urine: 31 mg/dL

## 2020-09-09 LAB — RESPIRATORY PANEL BY RT PCR (FLU A&B, COVID)
Influenza A by PCR: NEGATIVE
Influenza B by PCR: NEGATIVE
SARS Coronavirus 2 by RT PCR: NEGATIVE

## 2020-09-09 LAB — COMPREHENSIVE METABOLIC PANEL
ALT: 16 U/L (ref 0–44)
AST: 19 U/L (ref 15–41)
Albumin: 2.7 g/dL — ABNORMAL LOW (ref 3.5–5.0)
Alkaline Phosphatase: 128 U/L — ABNORMAL HIGH (ref 38–126)
Anion gap: 11 (ref 5–15)
BUN: 6 mg/dL (ref 6–20)
CO2: 20 mmol/L — ABNORMAL LOW (ref 22–32)
Calcium: 8.7 mg/dL — ABNORMAL LOW (ref 8.9–10.3)
Chloride: 108 mmol/L (ref 98–111)
Creatinine, Ser: 0.56 mg/dL (ref 0.44–1.00)
GFR calc Af Amer: >60 mL/min (ref >60)
GFR calc non Af Amer: >60 mL/min (ref >60)
Glucose, Bld: 78 mg/dL (ref 70–99)
Potassium: 3 mmol/L — ABNORMAL LOW (ref 3.5–5.1)
Sodium: 139 mmol/L (ref 135–145)
Total Bilirubin: 0.4 mg/dL (ref 0.3–1.2)
Total Protein: 5.5 g/dL — ABNORMAL LOW (ref 6.5–8.1)

## 2020-09-09 LAB — CBC
HCT: 36.6 % (ref 36.0–46.0)
Hemoglobin: 11.8 g/dL — ABNORMAL LOW (ref 12.0–15.0)
MCH: 30.7 pg (ref 26.0–34.0)
MCHC: 32.2 g/dL (ref 30.0–36.0)
MCV: 95.3 fL (ref 80.0–100.0)
Platelets: 207 10*3/uL (ref 150–400)
RBC: 3.84 MIL/uL — ABNORMAL LOW (ref 3.87–5.11)
RDW: 17.8 % — ABNORMAL HIGH (ref 11.5–15.5)
WBC: 10.3 10*3/uL (ref 4.0–10.5)
nRBC: 0 % (ref 0.0–0.2)

## 2020-09-09 LAB — TYPE AND SCREEN
ABO/RH(D): AB POS
Antibody Screen: NEGATIVE

## 2020-09-09 MED ORDER — SOD CITRATE-CITRIC ACID 500-334 MG/5ML PO SOLN: 30.0000 mL | ORAL | Status: DC | PRN

## 2020-09-09 MED ORDER — TERBUTALINE SULFATE 1 MG/ML IJ SOLN
0.2500 mg | Freq: Once | INTRAMUSCULAR | Status: DC | PRN
  Filled 2020-09-09: qty 1

## 2020-09-09 MED ORDER — ALBUTEROL SULFATE (2.5 MG/3ML) 0.083% IN NEBU: 3.0000 mL | INHALATION_SOLUTION | Freq: Four times a day (QID) | RESPIRATORY_TRACT | Status: DC | PRN

## 2020-09-09 MED ORDER — HYDROXYZINE HCL 25 MG PO TABS: 50.0000 mg | ORAL_TABLET | Freq: Four times a day (QID) | ORAL | Status: DC | PRN

## 2020-09-09 MED ORDER — OXYTOCIN-SODIUM CHLORIDE 30-0.9 UT/500ML-% IV SOLN
2.5000 [IU]/h | INTRAVENOUS | Status: DC
  Administered 2020-09-11: 2.5 [IU]/h via INTRAVENOUS

## 2020-09-09 MED ORDER — LIDOCAINE HCL (PF) 1 % IJ SOLN
30.0000 mL | INTRAMUSCULAR | Status: DC | PRN
  Filled 2020-09-09: qty 30

## 2020-09-09 MED ORDER — CITALOPRAM HYDROBROMIDE 20 MG PO TABS: 20.0000 mg | ORAL_TABLET | Freq: Every day | ORAL | Status: DC

## 2020-09-09 MED ORDER — BUPROPION HCL ER (XL) 150 MG PO TB24
150.0000 mg | ORAL_TABLET | Freq: Every day | ORAL | Status: DC
  Administered 2020-09-09 – 2020-09-12 (×3): 150 mg via ORAL
  Filled 2020-09-09 (×5): qty 1

## 2020-09-09 MED ORDER — LACTATED RINGERS IV SOLN: 500.0000 mL | INTRAVENOUS | Status: DC | PRN

## 2020-09-09 MED ORDER — OXYTOCIN BOLUS FROM INFUSION
333.0000 mL | Freq: Once | INTRAVENOUS | Status: AC
  Administered 2020-09-11: 333 mL via INTRAVENOUS

## 2020-09-09 MED ORDER — PANTOPRAZOLE SODIUM 40 MG PO TBEC: 40.0000 mg | DELAYED_RELEASE_TABLET | Freq: Every day | ORAL | Status: DC

## 2020-09-09 MED ORDER — ONDANSETRON HCL 4 MG/2ML IJ SOLN
4.0000 mg | Freq: Four times a day (QID) | INTRAMUSCULAR | Status: DC | PRN
  Administered 2020-09-10 (×2): 4 mg via INTRAVENOUS
  Filled 2020-09-09 (×2): qty 2

## 2020-09-09 MED ORDER — MISOPROSTOL 100 MCG PO TABS
25.0000 ug | ORAL_TABLET | ORAL | Status: DC | PRN
  Administered 2020-09-09 – 2020-09-10 (×3): 25 ug via VAGINAL
  Filled 2020-09-09 (×3): qty 1

## 2020-09-09 MED ORDER — FLUTICASONE FUROATE-VILANTEROL 200-25 MCG/INH IN AEPB
1.0000 | INHALATION_SPRAY | Freq: Every day | RESPIRATORY_TRACT | Status: DC | PRN
  Filled 2020-09-09: qty 28

## 2020-09-09 MED ORDER — PANTOPRAZOLE SODIUM 40 MG PO TBEC
40.0000 mg | DELAYED_RELEASE_TABLET | Freq: Every day | ORAL | Status: DC
  Administered 2020-09-09 – 2020-09-12 (×3): 40 mg via ORAL
  Filled 2020-09-09 (×3): qty 1

## 2020-09-09 MED ORDER — FENTANYL CITRATE (PF) 100 MCG/2ML IJ SOLN
50.0000 ug | INTRAMUSCULAR | Status: DC | PRN
  Administered 2020-09-10 (×2): 100 ug via INTRAVENOUS
  Filled 2020-09-09 (×2): qty 2

## 2020-09-09 MED ORDER — LACTATED RINGERS IV SOLN: INTRAVENOUS | Status: DC

## 2020-09-09 MED ORDER — OXYCODONE-ACETAMINOPHEN 5-325 MG PO TABS: 1.0000 | ORAL_TABLET | ORAL | Status: DC | PRN

## 2020-09-09 MED ORDER — OXYCODONE-ACETAMINOPHEN 5-325 MG PO TABS: 2.0000 | ORAL_TABLET | ORAL | Status: DC | PRN

## 2020-09-09 MED ORDER — BUPROPION HCL ER (XL) 150 MG PO TB24: 150.0000 mg | ORAL_TABLET | Freq: Every day | ORAL | Status: DC

## 2020-09-09 MED ORDER — ACETAMINOPHEN 325 MG PO TABS: 650.0000 mg | ORAL_TABLET | ORAL | Status: DC | PRN

## 2020-09-09 MED ORDER — CITALOPRAM HYDROBROMIDE 20 MG PO TABS
20.0000 mg | ORAL_TABLET | Freq: Every day | ORAL | Status: DC
  Administered 2020-09-09 – 2020-09-12 (×3): 20 mg via ORAL
  Filled 2020-09-09 (×4): qty 1

## 2020-09-09 NOTE — H&P (Signed)
HPI: 24 y/o G1P0 @ [redacted]w[redacted]d estimated gestational age (as dated by LMP c/w 20 week ultrasound) presents for IOL due to gestational HTN.   no Leaking of Fluid,   no Vaginal Bleeding,   no Uterine Contractions,  + Fetal Movement.  Prenatal care has been provided by Dr. Charlotta Newton  ROS: no HA, no epigastric pain, no visual changes.    Pregnancy complicated by: 1) Gestational HTN: BPs 130-140/90s 2) Anxiety: wellbutrin XL 150mg  daily, celexa 40mg  daily 3) Asthma- home meds include Breo and ProAir 4) Morbid obesity Last @ [redacted]w[redacted]d- vertex/anterior/EFW: 6#6oz (45%)   Prenatal Transfer Tool  Maternal Diabetes: No Genetic Screening: Normal Maternal Ultrasounds/Referrals: Normal Fetal Ultrasounds or other Referrals:  None Maternal Substance Abuse:  No Significant Maternal Medications:  Meds include: Other: wellbutrin, celexa, breo, proair, protonix Significant Maternal Lab Results: Group B Strep negative   PNL:  GBS negative, Rub Immune, Hep B neg, RPR NR, HIV neg, GC/C neg, glucola:136 Hgb: 12 Blood type: AB positive, antibody neg  Immunizations: Tdap: 8/3  OBHx: primip PMHx:  Anxiety, asthma, GERD Meds:  PNV, wellbutrin, celexa, breo, proair, protonix Allergy:   Allergies  Allergen Reactions  . Penicillins Hives   SurgHx: none SocHx:   no Tobacco, no  EtOH, no Illicit Drugs  O: BP (!) 147/87   Pulse (!) 103   Temp 98.5 F (36.9 C) (Oral)   Resp 18   Ht 5\' 3"  (1.6 m)   Wt (!) 238.3 kg   LMP 12/29/2019   BMI 93.07 kg/m    Gen. AAOx3, NAD CV.  RRR  No murmur.  Resp. CTAB, no wheeze or crackles. Abd. Gravid,  no tenderness,  no rigidity,  no guarding Extr.  no edema B/L , no calf tenderness, neg Homan's B/L FHT: 150 by doppler in office SVE: FT/long/high   Labs: see orders  A/P:  24 y.o. G1P0 @ 109w1d EGA who presents for IOL due to gestational HTN -FWB:  reassuring -Labor: plan for IOL with cytotec -GBS: negative -Gestational HTN  -baseline labs to be  completed -Anxiety: continue home meds -Asthma: continue home meds  12/31/2019, DO [phone] (cell) [phone] (office)

## 2020-09-10 ENCOUNTER — Inpatient Hospital Stay (HOSPITAL_COMMUNITY): Payer: Federal, State, Local not specified - PPO | Admitting: Anesthesiology

## 2020-09-10 DIAGNOSIS — Z3A39 39 weeks gestation of pregnancy: Secondary | ICD-10-CM | POA: Diagnosis not present

## 2020-09-10 DIAGNOSIS — O9952 Diseases of the respiratory system complicating childbirth: Secondary | ICD-10-CM | POA: Diagnosis not present

## 2020-09-10 DIAGNOSIS — J45909 Unspecified asthma, uncomplicated: Secondary | ICD-10-CM | POA: Diagnosis not present

## 2020-09-10 LAB — CBC
HCT: 38.9 % (ref 36.0–46.0)
Hemoglobin: 12.8 g/dL (ref 12.0–15.0)
MCH: 31.2 pg (ref 26.0–34.0)
MCHC: 32.9 g/dL (ref 30.0–36.0)
MCV: 94.9 fL (ref 80.0–100.0)
Platelets: 209 10*3/uL (ref 150–400)
RBC: 4.1 MIL/uL (ref 3.87–5.11)
RDW: 17.4 % — ABNORMAL HIGH (ref 11.5–15.5)
WBC: 12.2 10*3/uL — ABNORMAL HIGH (ref 4.0–10.5)
nRBC: 0 % (ref 0.0–0.2)

## 2020-09-10 LAB — RPR: RPR Ser Ql: NONREACTIVE

## 2020-09-10 MED ORDER — OXYTOCIN-SODIUM CHLORIDE 30-0.9 UT/500ML-% IV SOLN
1.0000 m[IU]/min | INTRAVENOUS | Status: DC
  Administered 2020-09-10: 2 m[IU]/min via INTRAVENOUS
  Administered 2020-09-10: 6 m[IU]/min via INTRAVENOUS
  Filled 2020-09-10: qty 500

## 2020-09-10 MED ORDER — DIPHENHYDRAMINE HCL 50 MG/ML IJ SOLN: 12.5000 mg | INTRAMUSCULAR | Status: DC | PRN

## 2020-09-10 MED ORDER — EPHEDRINE 5 MG/ML INJ: 10.0000 mg | INTRAVENOUS | Status: DC | PRN

## 2020-09-10 MED ORDER — FENTANYL-BUPIVACAINE-NACL 0.5-0.125-0.9 MG/250ML-% EP SOLN
12.0000 mL/h | EPIDURAL | Status: DC | PRN
  Administered 2020-09-10: 12 mL/h via EPIDURAL
  Filled 2020-09-10 (×2): qty 250

## 2020-09-10 MED ORDER — PHENYLEPHRINE 40 MCG/ML (10ML) SYRINGE FOR IV PUSH (FOR BLOOD PRESSURE SUPPORT): 80.0000 ug | PREFILLED_SYRINGE | INTRAVENOUS | Status: DC | PRN

## 2020-09-10 MED ORDER — LIDOCAINE HCL (PF) 1 % IJ SOLN
INTRAMUSCULAR | Status: DC | PRN
  Administered 2020-09-10: 3 mL via EPIDURAL
  Administered 2020-09-10: 8 mL via EPIDURAL

## 2020-09-10 MED ORDER — LACTATED RINGERS IV SOLN
500.0000 mL | Freq: Once | INTRAVENOUS | Status: AC
  Administered 2020-09-10: 500 mL via INTRAVENOUS

## 2020-09-10 MED ORDER — PHENYLEPHRINE 40 MCG/ML (10ML) SYRINGE FOR IV PUSH (FOR BLOOD PRESSURE SUPPORT)
80.0000 ug | PREFILLED_SYRINGE | INTRAVENOUS | Status: DC | PRN
  Filled 2020-09-10: qty 10

## 2020-09-10 MED ORDER — SODIUM CHLORIDE (PF) 0.9 % IJ SOLN
INTRAMUSCULAR | Status: DC | PRN
Start: 2020-09-10 — End: 2020-09-12
  Administered 2020-09-10: 12 mL/h via EPIDURAL

## 2020-09-10 NOTE — Anesthesia Preprocedure Evaluation (Signed)
Anesthesia Evaluation    Airway Mallampati: II       Dental no notable dental hx.    Pulmonary asthma , former smoker,    Pulmonary exam normal        Cardiovascular Normal cardiovascular exam     Neuro/Psych  Headaches, PSYCHIATRIC DISORDERS Anxiety    GI/Hepatic GERD  Medicated,  Endo/Other  Morbid obesity  Renal/GU   negative genitourinary   Musculoskeletal negative musculoskeletal ROS (+)   Abdominal (+) + obese,   Peds  Hematology   Anesthesia Other Findings   Reproductive/Obstetrics                             Anesthesia Physical Anesthesia Plan  ASA: III  Anesthesia Plan: Epidural   Post-op Pain Management:    Induction:   PONV Risk Score and Plan:   Airway Management Planned:   Additional Equipment:   Intra-op Plan:   Post-operative Plan:   Informed Consent:   Plan Discussed with:   Anesthesia Plan Comments:         Anesthesia Quick Evaluation

## 2020-09-10 NOTE — Progress Notes (Signed)
OB Progress Note  After multiple reattempts at practice pushing, fetal heart rate deceleration to 90s were noted without return to baseline for 5.5 minutes.  After pushing was stopped, FHR returned to baseline of 120s with moderate variability.  Pitocin turned off and patient turned to left side.  Completely dilated but still 0 station.  Will leave pitocin off for ~15 minutes to allow for fetal recovery and restart in hopes of having a vaginal delivery.  Discussed that if no fetal descent and persistent decelerations, cesarean section would be recommended.  She was consented to CS and blood products and they were signed and witnessed.  I have explained to the patient that this surgery is performed to deliver their baby or babies through an incision in the abdomen and incision in the uterus.  Prior to surgery, the risks and benefits of the surgery, as well as alternative treatments were discussed.  The risks include, but are not limited to, possible need for cesarean delivery for all future pregnancies, bleeding at the time of surgery that could necessitate a blood transfusion and/or hysterectomy, rupture of the uterus during a future pregnancy that could cause a preterm delivery and/or requiring hysterectomy, infection, damage to surrounding organs and tissues, damage to bladder, damage to ureters, causing kidney damage, and requiring additional procedures, damage to bowels, resulting in further surgery, postoperative pain, short-term and long-term, scarring on the abdominal wall and intra-abdominally, need for further surgery, development of an incisional hernia, deep vein thrombosis and/or pulmonary embolism, wound infection and/or separation, painful intercourse, urinary leakage, impact on future pregnancies including but not limited to, abnormal location or attachment of the placenta to the uterus, such as placenta previa or accreta, that may necessitate a blood transfusion and/or hysterectomy, impact on  total family size, complications the course of which cannot be predicted or prevented, and death. Patient was consented for blood products.  The patient is aware that bleeding may result in the need for a blood transfusion which includes risk of transmission of HIV (1:2 million), Hepatitis C (1:2 million), and Hepatitis B (1:200 thousand) and transfusion reaction.  Patient voiced understanding of the above risks as well as understanding of indications for blood transfusion.  Steva Ready, DO

## 2020-09-10 NOTE — Anesthesia Procedure Notes (Signed)
Epidural Patient location during procedure: OB Start time: 09/10/2020 10:00 AM End time: 09/10/2020 10:02 AM  Staffing Anesthesiologist: Leilani Able, MD Performed: anesthesiologist   Preanesthetic Checklist Completed: patient identified, IV checked, site marked, risks and benefits discussed, surgical consent, monitors and equipment checked, pre-op evaluation and timeout performed  Epidural Patient position: sitting Prep: DuraPrep and site prepped and draped Patient monitoring: continuous pulse ox and blood pressure Approach: midline Location: L3-L4 Injection technique: LOR air  Needle:  Needle type: Tuohy  Needle gauge: 17 G Needle length: 9 cm and 9 Needle insertion depth: 8 cm Catheter type: closed end flexible Catheter size: 19 Gauge Catheter at skin depth: 13 cm Test dose: negative and Other  Assessment Events: blood not aspirated, injection not painful, no injection resistance, no paresthesia and negative IV test  Additional Notes Reason for block:procedure for pain

## 2020-09-10 NOTE — Progress Notes (Signed)
OB PN:  S: Pt resting comfortably, no acute complaints  O: BP 128/64   Pulse 93   Temp 98.1 F (36.7 C)   Resp 18   Ht 5\' 3"  (1.6 m)   Wt 114.3 kg   LMP 12/29/2019   SpO2 97%   BMI 44.64 kg/m   FHT: 130 bpm, moderate variablity, + accels, no decels Toco: q70min SVE: 4/90/-2, AROM- minimal fluid, IUPC placed  A/P: 24 y.o. G1P0 @ [redacted]w[redacted]d for IOL due to gestational HTN 1. FWB: Cat. I 2. Labor: continue Pit per protocol- IUPC placed for further titration Pain: continue epidural GBS: negative -Gestational HTN- BP currently well controlled, will continue to closely monitor -Anxiety: continue home medication- WellbutrinXL and Celexa -Asthma: home prn meds ordered  [redacted]w[redacted]d, DO [phone] (cell) [phone] (office)

## 2020-09-10 NOTE — Progress Notes (Addendum)
OB Progress Note  S: Patient feeling urge to push with each contraction and would like to practice push.  Not feeling contractions but only pressure.  Multiple practice pushes were performed.  O: BP 129/74   Pulse 100   Temp 98.1 F (36.7 C) (Oral)   Resp 16   Ht 5\' 3"  (1.6 m)   Wt 114.3 kg   LMP 12/29/2019   SpO2 97%   BMI 44.64 kg/m   FHT: 145bpm, moderate variablity, - accels, - decels  decelerations to 90s with pushing, slow recovery to normal baseline after multiple pushes Toco: q1-1.5 minutes SVE: complete/0, LOA  A/P: 24 y.o. G1P0 @ [redacted]w[redacted]d admitted for IOL for gestational HTN.  1. FWB: Cat. II 2. Labor: Allow to labor down and for fetal recovery.  FSE placed for more accurate FHT.  Will return in ~45 min to 1 hour to push.  Consented to vacuum assisted delivery if indicated. Pain: Epidural in place GBS: negative  Consent: Benefits of operative vaginal delivery (vacuum) discussed including shortening the duration of the second stage of labor and decreasing maternal pushing or helping with maternal exhaustion.  Risks include minor injuries to the baby such as bruising or swelling that will resolve.  In rare cases more serious injuries can occur including but not limited to permanent fetal or maternal injury.  The patient verbalized understanding and agreed to proceed with OVD.  [redacted]w[redacted]d, DO [phone] (office)

## 2020-09-10 NOTE — Progress Notes (Signed)
OB PN:  S: Pt resting comfortably, no acute complaints  O: BP 121/73   Pulse 90   Temp 98 F (36.7 C) (Oral)   Resp 20   Ht 5\' 3"  (1.6 m)   Wt 114.3 kg   LMP 12/29/2019   BMI 44.64 kg/m   FHT: 125bpm, moderate variablity, + accels, no decels Toco: irregular SVE: deferred  A/P: 24 y.o. G1P0 @ [redacted]w[redacted]d for IOL due to gestational HTN 1. FWB: Cat. I 2. Labor: s/p cytotec #2, plan to transition to Pitocin this am Pain: IV or epidural upon request GBS: negative -Gestational HTN- continue to closely monitor, suspect anxiety is also a component -Anxiety: continue home medication- Wellbutrin and Celexa -Asthma: home prn meds ordered  [redacted]w[redacted]d, DO [phone] (cell) [phone] (office)

## 2020-09-10 NOTE — Progress Notes (Signed)
OB PN:  S: Pt resting comfortably, no acute complaints  O: BP (!) 145/82   Pulse 82   Temp 98.2 F (36.8 C) (Oral)   Resp 18   Ht 5\' 3"  (1.6 m)   Wt 114.3 kg   LMP 12/29/2019   SpO2 97%   BMI 44.64 kg/m   FHT: 125bpm, moderate variablity, + accels, no decels Toco: irregular SVE: 3/25/-3  A/P: 24 y.o. G1P0 @ [redacted]w[redacted]d for IOL due to gestational HTN 1. FWB: Cat. I 2. Labor: continue Pit per protocol Pain: continue epidural GBS: negative -Gestational HTN- continue to closely monitor, suspect anxiety is also a component -Anxiety: continue home medication- Wellbutrin and Celexa -Asthma: home prn meds ordered  [redacted]w[redacted]d, DO [phone] (cell) [phone] (office)

## 2020-09-11 ENCOUNTER — Encounter (HOSPITAL_COMMUNITY): Payer: Self-pay | Admitting: Obstetrics & Gynecology

## 2020-09-11 ENCOUNTER — Inpatient Hospital Stay (HOSPITAL_COMMUNITY): Payer: Federal, State, Local not specified - PPO

## 2020-09-11 DIAGNOSIS — Z3A39 39 weeks gestation of pregnancy: Secondary | ICD-10-CM | POA: Diagnosis not present

## 2020-09-11 DIAGNOSIS — Z3A00 Weeks of gestation of pregnancy not specified: Secondary | ICD-10-CM | POA: Diagnosis not present

## 2020-09-11 DIAGNOSIS — O411290 Chorioamnionitis, unspecified trimester, not applicable or unspecified: Secondary | ICD-10-CM | POA: Diagnosis not present

## 2020-09-11 LAB — CBC
HCT: 34.8 % — ABNORMAL LOW (ref 36.0–46.0)
Hemoglobin: 11.6 g/dL — ABNORMAL LOW (ref 12.0–15.0)
MCH: 31.8 pg (ref 26.0–34.0)
MCHC: 33.3 g/dL (ref 30.0–36.0)
MCV: 95.3 fL (ref 80.0–100.0)
Platelets: 198 10*3/uL (ref 150–400)
RBC: 3.65 MIL/uL — ABNORMAL LOW (ref 3.87–5.11)
RDW: 17.5 % — ABNORMAL HIGH (ref 11.5–15.5)
WBC: 20.1 10*3/uL — ABNORMAL HIGH (ref 4.0–10.5)
nRBC: 0 % (ref 0.0–0.2)

## 2020-09-11 MED ORDER — DIPHENHYDRAMINE HCL 25 MG PO CAPS: 25.0000 mg | ORAL_CAPSULE | Freq: Four times a day (QID) | ORAL | Status: DC | PRN

## 2020-09-11 MED ORDER — COCONUT OIL OIL: 1.0000 "application " | TOPICAL_OIL | Status: DC | PRN

## 2020-09-11 MED ORDER — WITCH HAZEL-GLYCERIN EX PADS: 1.0000 "application " | MEDICATED_PAD | CUTANEOUS | Status: DC | PRN

## 2020-09-11 MED ORDER — ONDANSETRON HCL 4 MG PO TABS: 4.0000 mg | ORAL_TABLET | ORAL | Status: DC | PRN

## 2020-09-11 MED ORDER — PRENATAL MULTIVITAMIN CH
1.0000 | ORAL_TABLET | Freq: Every day | ORAL | Status: DC
  Administered 2020-09-11 – 2020-09-13 (×3): 1 via ORAL
  Filled 2020-09-11 (×4): qty 1

## 2020-09-11 MED ORDER — INFLUENZA VAC SPLIT QUAD 0.5 ML IM SUSY: 0.5000 mL | PREFILLED_SYRINGE | INTRAMUSCULAR | Status: DC

## 2020-09-11 MED ORDER — DIBUCAINE (PERIANAL) 1 % EX OINT: 1.0000 "application " | TOPICAL_OINTMENT | CUTANEOUS | Status: DC | PRN

## 2020-09-11 MED ORDER — METHYLERGONOVINE MALEATE 0.2 MG/ML IJ SOLN
INTRAMUSCULAR | Status: AC
  Filled 2020-09-11: qty 1

## 2020-09-11 MED ORDER — TRANEXAMIC ACID-NACL 1000-0.7 MG/100ML-% IV SOLN
1000.0000 mg | INTRAVENOUS | Status: AC
  Administered 2020-09-11: 1000 mg via INTRAVENOUS

## 2020-09-11 MED ORDER — MISOPROSTOL 200 MCG PO TABS: 1000.0000 ug | ORAL_TABLET | Freq: Once | ORAL | Status: AC

## 2020-09-11 MED ORDER — TRANEXAMIC ACID-NACL 1000-0.7 MG/100ML-% IV SOLN
INTRAVENOUS | Status: AC
  Filled 2020-09-11: qty 100

## 2020-09-11 MED ORDER — BENZOCAINE-MENTHOL 20-0.5 % EX AERO
1.0000 "application " | INHALATION_SPRAY | CUTANEOUS | Status: DC | PRN
  Administered 2020-09-11: 1 via TOPICAL
  Filled 2020-09-11: qty 56

## 2020-09-11 MED ORDER — SENNOSIDES-DOCUSATE SODIUM 8.6-50 MG PO TABS
2.0000 | ORAL_TABLET | ORAL | Status: DC
  Administered 2020-09-11 – 2020-09-13 (×2): 2 via ORAL
  Filled 2020-09-11 (×2): qty 2

## 2020-09-11 MED ORDER — ZOLPIDEM TARTRATE 5 MG PO TABS: 5.0000 mg | ORAL_TABLET | Freq: Every evening | ORAL | Status: DC | PRN

## 2020-09-11 MED ORDER — SIMETHICONE 80 MG PO CHEW: 80.0000 mg | CHEWABLE_TABLET | ORAL | Status: DC | PRN

## 2020-09-11 MED ORDER — METHYLERGONOVINE MALEATE 0.2 MG/ML IJ SOLN
0.2000 mg | Freq: Once | INTRAMUSCULAR | Status: AC
  Administered 2020-09-11: 0.2 mg via INTRAMUSCULAR

## 2020-09-11 MED ORDER — MISOPROSTOL 200 MCG PO TABS
ORAL_TABLET | ORAL | Status: AC
  Administered 2020-09-11: 1000 ug via RECTAL
  Filled 2020-09-11: qty 5

## 2020-09-11 MED ORDER — ONDANSETRON HCL 4 MG/2ML IJ SOLN: 4.0000 mg | INTRAMUSCULAR | Status: DC | PRN

## 2020-09-11 MED ORDER — IBUPROFEN 600 MG PO TABS
600.0000 mg | ORAL_TABLET | Freq: Four times a day (QID) | ORAL | Status: DC
  Administered 2020-09-11 – 2020-09-13 (×10): 600 mg via ORAL
  Filled 2020-09-11 (×10): qty 1

## 2020-09-11 MED ORDER — ACETAMINOPHEN 325 MG PO TABS
650.0000 mg | ORAL_TABLET | ORAL | Status: DC | PRN
  Administered 2020-09-12: 650 mg via ORAL
  Filled 2020-09-11: qty 2

## 2020-09-11 NOTE — Anesthesia Postprocedure Evaluation (Signed)
Anesthesia Post Note  Patient: Martha Yu  Procedure(s) Performed: AN AD HOC LABOR EPIDURAL     Patient location during evaluation: Mother Baby Anesthesia Type: Epidural Level of consciousness: awake and alert Pain management: pain level controlled Vital Signs Assessment: post-procedure vital signs reviewed and stable Respiratory status: spontaneous breathing, nonlabored ventilation and respiratory function stable Cardiovascular status: stable Postop Assessment: no headache, no backache and epidural receding Anesthetic complications: no   No complications documented.  Last Vitals:  Vitals:   09/11/20 0330 09/11/20 0429  BP: 133/81 (!) 145/83  Pulse: (!) 109 98  Resp: 17   Temp: 36.8 C 36.9 C  SpO2: 99%     Last Pain:  Vitals:   09/11/20 0630  TempSrc:   PainSc: Asleep   Pain Goal:                   [employer]

## 2020-09-11 NOTE — Social Work (Signed)
CSW received consult for hx of Anxiety and Depression.  CSW met with MOB to offer support and complete assessment.     CSW introduced self and role. CSW observed MOB holding infant and FOB bedside. Parent's were observed interacting appropriately with baby. MOB was pleasant in interaction and in agreement with CSW completing assessment with FOB present. CSW informed MOB of reason for consult and asked about mental health history. MOB stated she was diagnosed with anxiety in college. MOB stated depression was a result of the anxiety. MOB stated she is on Celexa and finds it to be very helpful to manage the anxiety. MOB stated she is not on any other medications. MOB stated she was started on Wellbutrin but did not find a need to continue the medication. MOB stated she has never attended therapy and finds speaking with the family to be a good way of coping. MOB identified FOB and family as supports. MOB declined any SI or HI.   CSW provided education regarding the baby blues period vs. perinatal mood disorders, discussed treatment and gave resources for mental health follow up if concerns arise.  CSW recommends self-evaluation during the postpartum time period using the New Mom Checklist from Postpartum Progress and encouraged MOB to contact a medical professional if symptoms are noted at any time.    CSW provided review of Sudden Infant Death Syndrome (SIDS) precautions. MOB stated baby will sleep in a basinet at discharge. MOB stated she has all of the essential needs for baby, including an unexpired carseat. MOB stated baby will receive follow-up care with Dr. McCormick and there are no transportation barriers.     MOB expressed she has no additional needs or concerns at this time. CSW identifies no further need for intervention and no barriers to discharge at this time.  Malee Grays, LCSWA Clinical Social Worker [facility] 

## 2020-09-11 NOTE — Progress Notes (Signed)
Right Forearm edematous and tender.  IV infiltrated in L&D.  Warm compress applied and elevated.  Edema slightly better but looks a bit more pink.  Will continue to monitor.

## 2020-09-12 LAB — CBC
HCT: 25.4 % — ABNORMAL LOW (ref 36.0–46.0)
Hemoglobin: 8.3 g/dL — ABNORMAL LOW (ref 12.0–15.0)
MCH: 31.6 pg (ref 26.0–34.0)
MCHC: 32.7 g/dL (ref 30.0–36.0)
MCV: 96.6 fL (ref 80.0–100.0)
Platelets: 149 10*3/uL — ABNORMAL LOW (ref 150–400)
RBC: 2.63 MIL/uL — ABNORMAL LOW (ref 3.87–5.11)
RDW: 17.5 % — ABNORMAL HIGH (ref 11.5–15.5)
WBC: 14.7 10*3/uL — ABNORMAL HIGH (ref 4.0–10.5)
nRBC: 0 % (ref 0.0–0.2)

## 2020-09-12 LAB — URINALYSIS, COMPLETE (UACMP) WITH MICROSCOPIC
Bilirubin Urine: NEGATIVE
Glucose, UA: NEGATIVE mg/dL
Ketones, ur: NEGATIVE mg/dL
Nitrite: NEGATIVE
Protein, ur: NEGATIVE mg/dL
Specific Gravity, Urine: 1.009 (ref 1.005–1.030)
pH: 6 (ref 5.0–8.0)

## 2020-09-12 MED ORDER — IBUPROFEN 600 MG PO TABS: 600.0000 mg | ORAL_TABLET | Freq: Four times a day (QID) | ORAL | 0 refills | Status: AC

## 2020-09-12 NOTE — Discharge Instructions (Signed)

## 2020-09-12 NOTE — Progress Notes (Signed)
At bedside for further evaluation due to arm pain  Pt feels like her arm has not gotten any better- she is very concerned about a blood clot.  She is able to move her arm- notes tightness and discomfort. Other than her arm she reports no acute complaints or concerns  O: Temp:  [98.4 F (36.9 C)-101.1 F (38.4 C)] 99.8 F (37.7 C) (10/01 1250) Pulse Rate:  [102-118] 109 (10/01 1250) Resp:  [18-28] 28 (10/01 1250) BP: (111-125)/(52-66) 125/59 (10/01 1250) SpO2:  [95 %-100 %] 95 % (10/01 1250)   Gen: A&Ox3, NAD CV: RRR Resp: CTAB Abdomen: obese, soft, NT, ND Uterus: firm, non-tender, below umbilicus Ext: Left arm slightly warm to the touch- no erythema, minimal ecchymosis noted, +edema from wrist to forearm and specifically around infiltration sigte  Labs:  Recent Labs    09/10/20 0747 09/11/20 0330  HGB 12.8 11.6*    A/P: Pt is a 24 y.o. G1P1001 s/p NSVD, PPD#1 - Pain well controlled -Fever: etiology unclear- no evidence of infection on exam- plan to recheck CBC and UA.  No evidence of endometritis- will continue to closely monitor - Arm pain/swelling- reassured pt that this is due to recent IV, encouraged ice pack and pain medication when needed.  Will continue to monitor.  Should she note worsening of symptoms would consider imaging -GU: voiding freely -GI: Tolerating general diet -Activity: encouraged sitting up to chair and ambulation as tolerated -Prophylaxis: early ambulation  DISPO: Continue in-house management  Myna Hidalgo, DO [phone] (cell) [phone] (office)

## 2020-09-12 NOTE — Progress Notes (Signed)
Postpartum Note Day # 1  S:  Patient resting comfortable in bed.  Pain controlled.  Tolerating general diet. No flatus, no BM.  Lochia moderate.  Ambulating without difficulty.  She denies n/v/f/c, SOB, or CP.  Pt plans on breastfeeding. Notes swelling and pain in her right arm at prior IV site- minimal improvement with elevation, hot/cold compress  O: Temp:  [98.3 F (36.8 C)-99 F (37.2 C)] 98.4 F (36.9 C) (09/30 2230) Pulse Rate:  [102-118] 118 (09/30 2230) Resp:  [18-20] 20 (09/30 1713) BP: (122-137)/(52-79) 124/52 (09/30 2230) SpO2:  [99 %-100 %] 100 % (09/30 2230)   Gen: A&Ox3, NAD CV: RRR Resp: CTAB Abdomen: obese, soft, NT, ND Uterus: firm, non-tender, below umbilicus Ext: No edema, no calf tenderness bilaterally  Labs: Recent Labs    09/10/20 0747 09/11/20 0330  HGB 12.8 11.6*    A/P: Pt is a 24 y.o. G1P1001 s/p NSVD, PPD#1 - Pain well controlled - Swollen arm- reassured pt that this is due to recent IV, encouraged ice pack and pain medication when needed.  Will continue to monitor -GU: voiding freely -GI: Tolerating general diet -Activity: encouraged sitting up to chair and ambulation as tolerated -Prophylaxis: early ambulation -Labs: stable as above  DISPO: May consider early discharge home today  Myna Hidalgo, DO [phone] (cell) [phone] (office)

## 2020-09-12 NOTE — Progress Notes (Signed)
Pt rested well and had a full meal.  She states she feels much better.  Right arm pain better, now a 2/10 and is able to move it more freely.  Just finished breastfeeding infant for about 35 min, off and on.  Infant fussy but easily consolable.    Bedside report given to Wendall Papa, RN.

## 2020-09-13 DIAGNOSIS — D62 Acute posthemorrhagic anemia: Secondary | ICD-10-CM | POA: Diagnosis not present

## 2020-09-13 DIAGNOSIS — F419 Anxiety disorder, unspecified: Secondary | ICD-10-CM | POA: Diagnosis present

## 2020-09-13 MED ORDER — CITALOPRAM HYDROBROMIDE 20 MG PO TABS
40.0000 mg | ORAL_TABLET | Freq: Every day | ORAL | Status: DC
  Administered 2020-09-13: 40 mg via ORAL
  Filled 2020-09-13: qty 2

## 2020-09-13 NOTE — Lactation Note (Signed)
This note was copied from a baby's chart. Lactation Consultation Note  Patient Name: Martha Yu [id number]'M Date: 09/13/2020 Reason for consult: Term;Primapara;1st time breastfeeding;Initial assessment  P1 mother whose infant is now 50 hours old.  This is a term baby at 39+3 weeks.  Mother was breast feeding when I arrived.  Discussed with mother basic breast feeding concepts prior to discharge.  Mother is familiar with hand expression.  She has been observing baby for feeding cues and feeding on cue.  Observed baby breast feeding in the football hold on the left breast.  Encouraged a deep latch and proper positioning at the level of the breast.  Discussed having a wide gape and flanged lips.  Mother denied pain with latching and nipples are rounded.  Engorgement prevention/treatment reviewed.  Provided a manual pump with instructions for use.  Mother has a DEBP for home use.  Father present.  Mother will call for any further questions/concerns prior to discharge.   Maternal Data Formula Feeding for Exclusion: Yes Reason for exclusion: Mother's choice to formula and breast feed on admission Has patient been taught Hand Expression?: Yes Does the patient have breastfeeding experience prior to this delivery?: No  Feeding Feeding Type: Formula Nipple Type: Slow - flow  LATCH Score Latch: Grasps breast easily, tongue down, lips flanged, rhythmical sucking.  Audible Swallowing: A few with stimulation  Type of Nipple: Everted at rest and after stimulation  Comfort (Breast/Nipple): Soft / non-tender  Hold (Positioning): Assistance needed to correctly position infant at breast and maintain latch.  LATCH Score: 8  Interventions    Lactation Tools Discussed/Used Pump Review: Setup, frequency, and cleaning (Demonstrated manual pump) Initiated by:: Jaleal Schliep Date initiated:: 09/13/20   Consult Status Consult Status: Complete    Lezlee Gills R Ayse Mccartin 09/13/2020, 9:08  AM

## 2020-09-13 NOTE — Discharge Summary (Addendum)
SVD OB Discharge Summary for [facility]      Patient Name: Martha Yu DOB: 11-06-1996 MRN: [id number]  Date of admission: 09/09/2020 Delivering MD: Steva Ready  Date of delivery: 09/11/2020 Type of delivery: SVD  Newborn Data: Sex: Baby female Live born female  Birth Weight: 6 lb 6.1 oz (2895 g) APGAR: 8, 9  Newborn Delivery   Birth date/time: 09/11/2020 00:43:00 Delivery type: Vaginal, Spontaneous      Feeding: breast Infant being discharge to home with mother in stable condition.   Admitting diagnosis: Gestational hypertension [O13.9] Intrauterine pregnancy: [redacted]w[redacted]d     Secondary diagnosis:  Principal Problem:   Postpartum care following vaginal delivery 9/30 Active Problems:   Gestational hypertension   SVD (spontaneous vaginal delivery) 9/30   Anxiety   Acute blood loss anemia                                Complications: None                                                              Intrapartum Procedures: spontaneous vaginal delivery Postpartum Procedures: none Complications-Operative and Postpartum: sulcus degree perineal laceration Augmentation: AROM, Pitocin and Cytotec   History of Present Illness: Martha Yu is a 24 y.o. female, G1P1001, who presents at [redacted]w[redacted]d weeks gestation. The patient has been followed at  [facility]  Her pregnancy has been complicated by:  Patient Active Problem List   Diagnosis Date Noted  . SVD (spontaneous vaginal delivery) 9/30 09/13/2020  . Postpartum care following vaginal delivery 9/30 09/13/2020  . Anxiety 09/13/2020  . Acute blood loss anemia 09/13/2020  . Gestational hypertension 09/09/2020  . Mild persistent asthma 10/30/2019  . H/O menorrhagia 12/15/2012  . H/O dysmenorrhea 12/15/2012  . Acne 12/15/2012  . Family history of diabetes mellitus 12/15/2011    Hospital course:  Induction of Labor With Vaginal Delivery   24 y.o. yo G1P1001 at [redacted]w[redacted]d was  admitted to the hospital 09/09/2020 for induction of labor.  Indication for induction: Gestational hypertension.   Pregnancy complicated by: 1) Gestational HTN: BPs 130-140/90s 2) Anxiety: wellbutrin XL 150mg  daily, celexa 40mg  daily 3) Asthma- home meds include Breo and ProAir 4) Morbid obesity Patient had an uncomplicated labor course as follows: Membrane Rupture Time/Date: 6:35 PM ,09/10/2020   Delivery Method:Vaginal, Spontaneous  Episiotomy: None  Lacerations:  Sulcus;Vaginal  Details of delivery can be found in separate delivery note.  Patient had a routine postpartum course. Patient is discharged home 09/13/20.  Newborn Data: Birth date:09/11/2020  Birth time:12:43 AM  Gender:Female  Living status:Living  Apgars:8 ,9  Weight:2895 g  Postpartum Day # 2 : S/P NSVD due to IOL for GHTN no meds, BP stable at 138/72, no mag indicated, denies HA, RUQ pain or vision changes. . Patient up ad lib, denies syncope or dizziness. Reports consuming regular diet without issues and denies N/V. Patient reports 0 bowel movement + passing flatus.  Denies issues with urination and reports bleeding is "lighter."  Patient is breast and bottlefeeding and reports going well.  Desires undecided for postpartum contraception.  Pain is being appropriately managed with use of po meds. H/O anxiety on celexa and  wellbutrin and will continue at home. Denies si/hi.   Physical exam  Vitals:   09/12/20 1728 09/12/20 2000 09/13/20 0210 09/13/20 0520  BP: 140/63 (!) 147/73 123/60 138/72  Pulse: (!) 109 (!) 118 (!) 102 99  Resp: 20 20 18 20   Temp: 98.8 F (37.1 C) 99.3 F (37.4 C) 98.5 F (36.9 C) 98.4 F (36.9 C)  TempSrc: Oral Oral Oral Oral  SpO2: 100% 100% 100% 100%  Weight:      Height:       General: alert, cooperative and no distress Lochia: appropriate Uterine Fundus: firm Perineum: approximate, no hematomas DVT Evaluation: No evidence of DVT seen on physical exam. Negative Homan's sign. No cords or  calf tenderness. No significant calf/ankle edema.  Labs: Lab Results  Component Value Date   WBC 14.7 (H) 09/12/2020   HGB 8.3 (L) 09/12/2020   HCT 25.4 (L) 09/12/2020   MCV 96.6 09/12/2020   PLT 149 (L) 09/12/2020   CMP Latest Ref Rng & Units 09/09/2020  Glucose 70 - 99 mg/dL 78  BUN 6 - 20 mg/dL 6  Creatinine 09/11/2020 - 8.34 mg/dL 1.96  Sodium 2.22 - 979 mmol/L 139  Potassium 3.5 - 5.1 mmol/L 3.0(L)  Chloride 98 - 111 mmol/L 108  CO2 22 - 32 mmol/L 20(L)  Calcium 8.9 - 10.3 mg/dL 892)  Total Protein 6.5 - 8.1 g/dL 1.1(H)  Total Bilirubin 0.3 - 1.2 mg/dL 0.4  Alkaline Phos 38 - 126 U/L 128(H)  AST 15 - 41 U/L 19  ALT 0 - 44 U/L 16    Date of discharge: 09/13/2020 Discharge Diagnoses: Term Pregnancy-delivered Discharge instruction: per After Visit Summary and "Baby and Me Booklet".  After visit meds:   Activity:           unrestricted and pelvic rest Advance as tolerated. Pelvic rest for 6 weeks.  Diet:                routine Medications: PNV, Ibuprofen, Colace and Iron Postpartum contraception: Undecided Condition:  Pt discharge to home with baby in stable and condition Anemia: PO Iron H/O Anxiety: Continue 40mg  celexa and 150mg  Wellbutrin daily, report si/hi, f/u with [facility] for 2 week mood check GHTN: no meds , monitor BP report >150/90s, and report HA, RUQ pain or vision changes. 1 weeks BP check with [facility] physicians.   Meds: Allergies as of 09/13/2020      Reactions   Penicillins Hives      Medication List    STOP taking these medications   Breo Ellipta 200-25 MCG/INH Aepb Generic drug: fluticasone furoate-vilanterol     TAKE these medications   albuterol 108 (90 Base) MCG/ACT inhaler Commonly known as: VENTOLIN HFA Inhale 2 puffs into the lungs every 4 (four) hours as needed for wheezing or shortness of breath.   buPROPion 150 MG 24 hr tablet Commonly known as: WELLBUTRIN XL Take 150 mg by mouth daily.   citalopram 40 MG tablet Commonly known as:  CELEXA Take 40 mg by mouth daily.   ferrous sulfate 325 (65 FE) MG tablet Take 325 mg by mouth daily.   ibuprofen 600 MG tablet Commonly known as: ADVIL Take 1 tablet (600 mg total) by mouth every 6 (six) hours.   loratadine 10 MG tablet Commonly known as: CLARITIN Take 10 mg by mouth daily.   pantoprazole 40 MG tablet Commonly known as: PROTONIX Take 40 mg by mouth daily.   polyvinyl alcohol 1.4 % ophthalmic solution Commonly known as: LIQUIFILM  TEARS Place 1 drop into both eyes as needed for dry eyes.   PRENATAL GUMMIES/DHA & FA PO Take by mouth.       Discharge Follow Up:   Follow-up Information    Myna Hidalgo, DO Follow up in 3 week(s).   Specialty: Obstetrics and Gynecology Contact information: [address] [place] [address] [phone]        Gynecology, [facility]. Schedule an appointment as soon as possible for a visit in 1 week(s).   Specialty: Obstetrics and Gynecology Why: 1 weeks BP appointment, 2 weeks mood check and 6 week f/u Contact information: [address] [place] [address] [phone]                [facility], NP-C, CNM 09/13/2020, 10:40 AM  Dale Menahga, FNP

## 2020-09-14 ENCOUNTER — Ambulatory Visit: Payer: Self-pay

## 2020-09-14 NOTE — Lactation Note (Signed)
This note was copied from a baby's chart. Lactation Consultation Note  Patient Name: Martha Yu CNGFR'E Date: 09/14/2020 Reason for consult: Follow-up assessment  LC Follow Up:  Per RN, no lactation visit required today.   Maternal Data    Feeding Feeding Type: Bottle Fed - Formula  LATCH Score                   Interventions    Lactation Tools Discussed/Used     Consult Status Consult Status: Complete    [name] 09/14/2020, 11:21 AM

## 2020-09-15 LAB — SURGICAL PATHOLOGY

## 2020-11-27 ENCOUNTER — Other Ambulatory Visit: Payer: Self-pay | Admitting: Pulmonary Disease

## 2020-12-02 ENCOUNTER — Encounter: Payer: Self-pay | Admitting: *Deleted

## 2021-01-22 DIAGNOSIS — J4530 Mild persistent asthma, uncomplicated: Secondary | ICD-10-CM | POA: Diagnosis not present

## 2021-01-22 DIAGNOSIS — F419 Anxiety disorder, unspecified: Secondary | ICD-10-CM | POA: Diagnosis not present

## 2021-01-22 DIAGNOSIS — J309 Allergic rhinitis, unspecified: Secondary | ICD-10-CM | POA: Diagnosis not present

## 2021-01-22 DIAGNOSIS — K219 Gastro-esophageal reflux disease without esophagitis: Secondary | ICD-10-CM | POA: Diagnosis not present

## 2021-08-14 DIAGNOSIS — J4530 Mild persistent asthma, uncomplicated: Secondary | ICD-10-CM | POA: Diagnosis not present

## 2021-08-14 DIAGNOSIS — F419 Anxiety disorder, unspecified: Secondary | ICD-10-CM | POA: Diagnosis not present

## 2021-08-14 DIAGNOSIS — F320 Major depressive disorder, single episode, mild: Secondary | ICD-10-CM | POA: Diagnosis not present

## 2021-12-21 IMAGING — US US MFM OB DETAIL+14 WK
1 series · 13 of 28 positions shown · non-contrast
Comparison: none

[Series 1: us mfm ob detail+14 wk · 107 acquisitions, 13 frames shown]
[im 4/107]
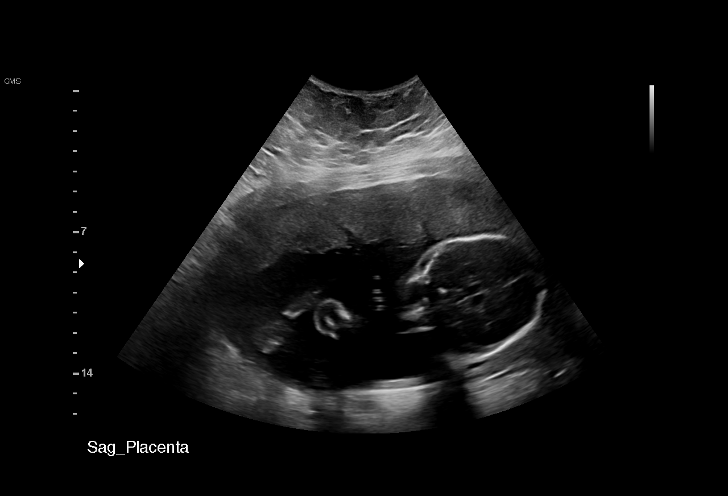
[im 12/107]
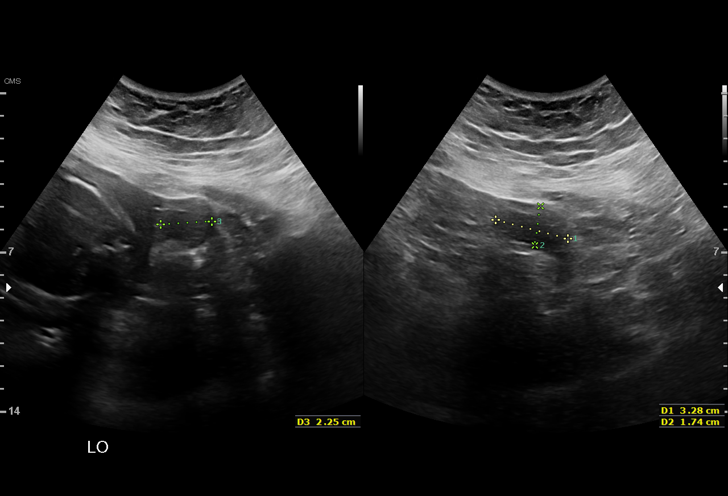
[im 20/107]
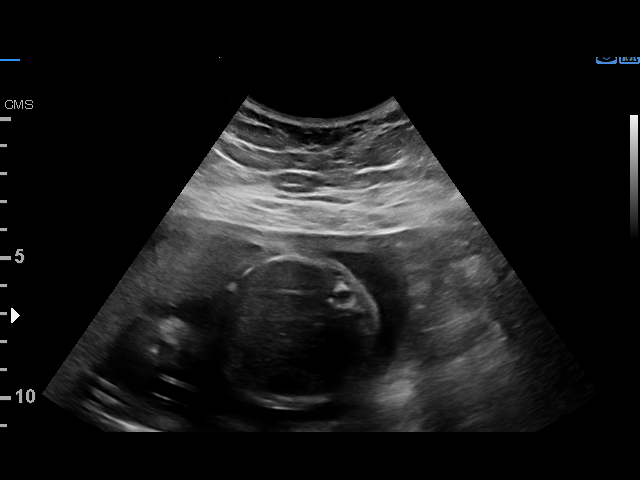
[im 28/107]
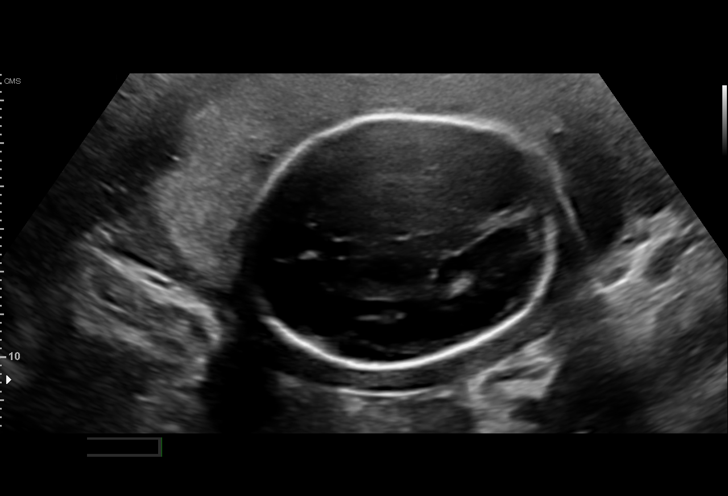
[im 36/107]
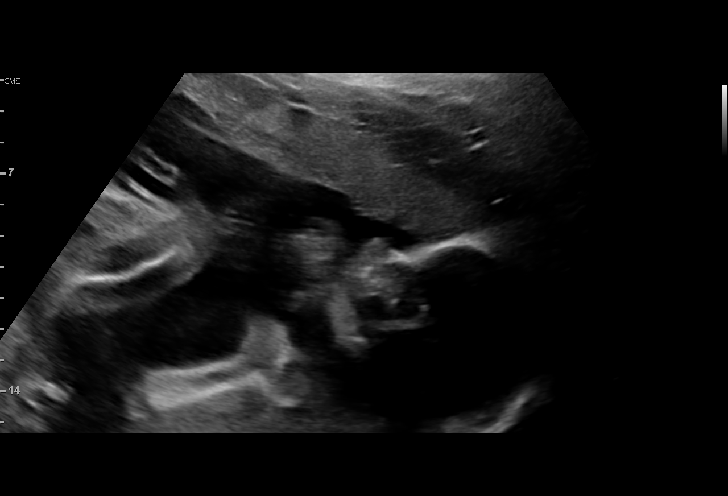
[im 44/107]
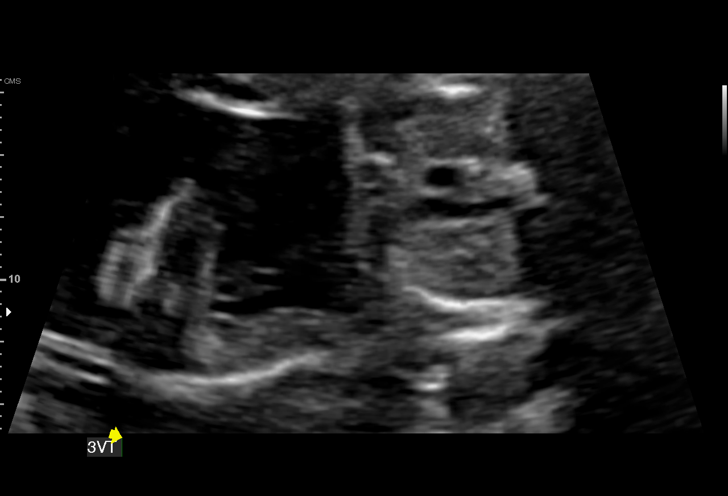
[im 55/107]
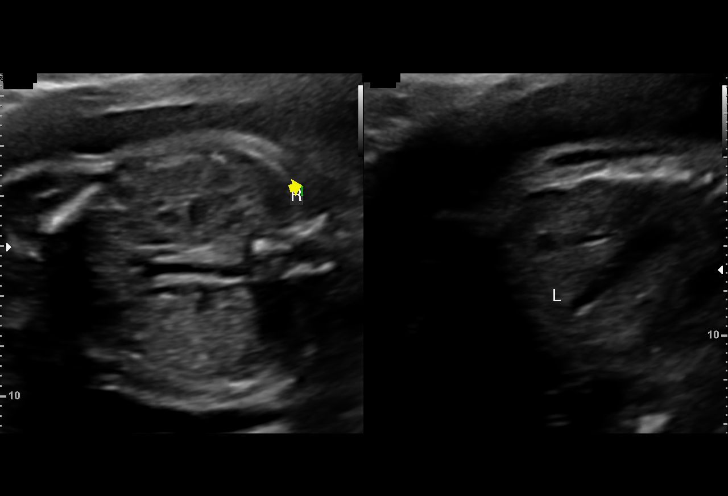
[im 63/107]
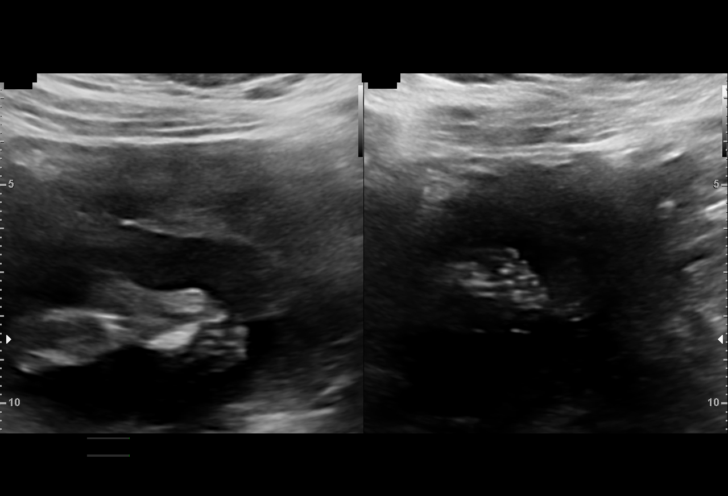
[im 71/107]
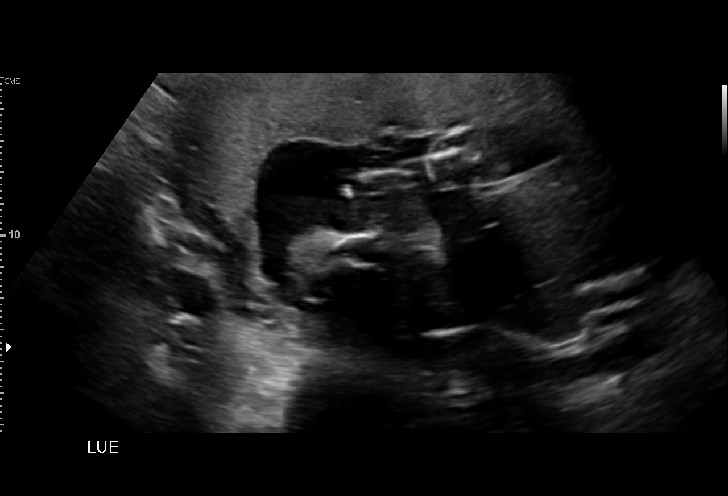
[im 79/107]
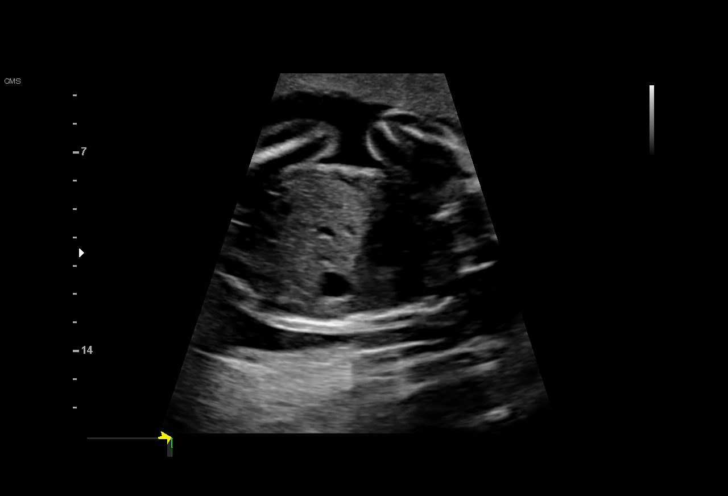
[im 87/107]
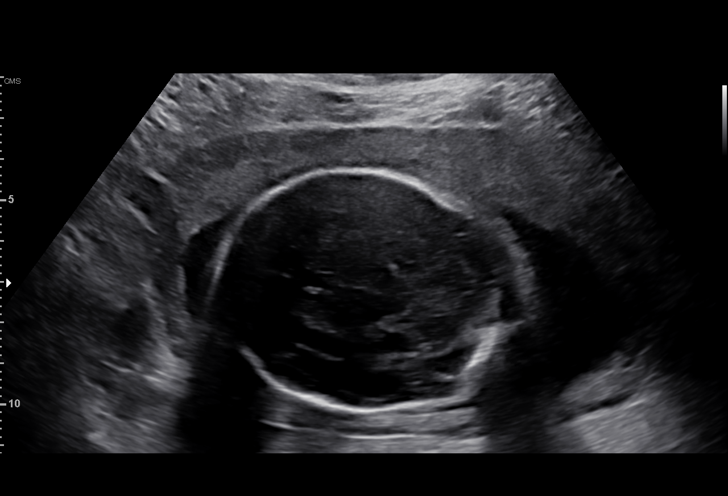
[im 95/107]
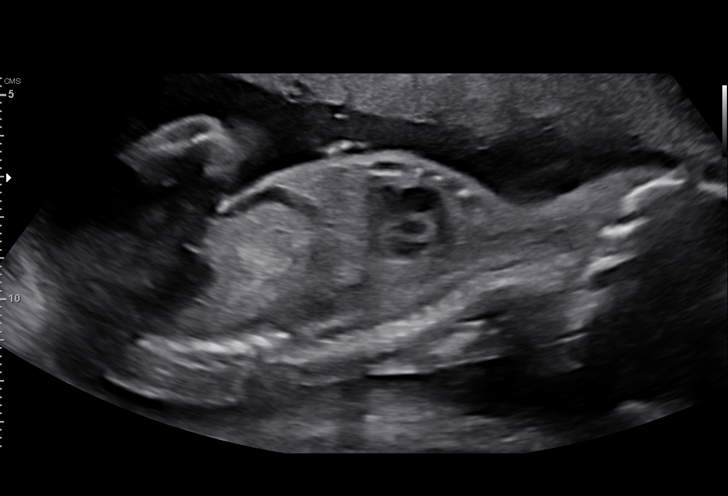
[im 103/107]
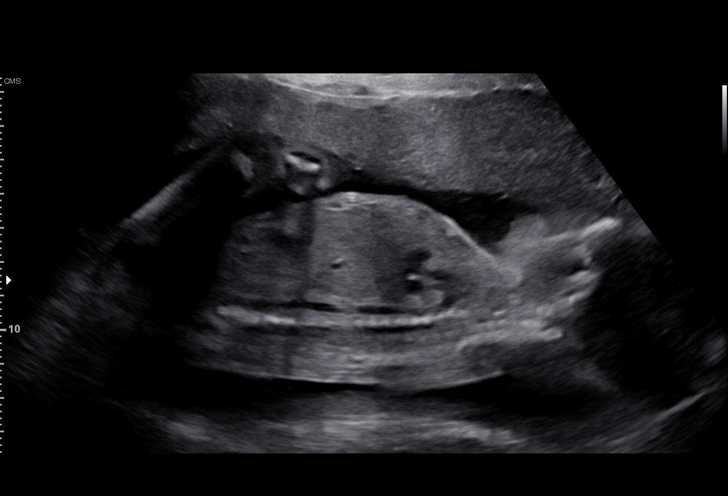

[13 of 28 positions shown; findings below may reference images not displayed]

Indications

 Obesity complicating pregnancy, second
 trimester (pregravid BMI 37)
 24 weeks gestation of pregnancy
 Antenatal screening for malformations
Fetal Evaluation

 Num Of Fetuses:         1
 Fetal Heart Rate(bpm):  142
 Cardiac Activity:       Observed
 Presentation:           Cephalic
 Placenta:               Anterior
 P. Cord Insertion:      Visualized

 Amniotic Fluid
 AFI FV:      Within normal limits

                             Largest Pocket(cm)

Biometry

 BPD:      58.3  mm     G. Age:  23w 6d         28  %    CI:         73.3   %    70 - 86
                                                         FL/HC:      18.9   %    18.7 -
 HC:      216.4  mm     G. Age:  23w 5d         13  %    HC/AC:      1.11        1.05 -
 AC:      194.7  mm     G. Age:  24w 1d         37  %    FL/BPD:     70.2   %    71 - 87
 FL:       40.9  mm     G. Age:  23w 2d         11  %    FL/AC:      21.0   %    20 - 24
 HUM:        39  mm     G. Age:  23w 6d         32  %
 CER:      25.9  mm     G. Age:  23w 3d         39  %

 Est. FW:     627  gm      1 lb 6 oz     20  %
OB History

 Gravidity:    1         Term:   0        Prem:   0        SAB:   0
 TOP:          0       Ectopic:  0        Living: 0
Gestational Age

 LMP:           24w 5d        Date:  12/07/19                 EDD:   09/12/20
 Clinical EDD:  24w 2d                                        EDD:   09/15/20
 U/S Today:     23w 5d                                        EDD:   09/19/20
 Best:          24w 2d     Det. By:  Clinical EDD             EDD:   09/15/20
Anatomy

 Cranium:               Appears normal         Aortic Arch:            Appears normal
 Cavum:                 Appears normal         Ductal Arch:            Appears normal
 Ventricles:            Appears normal         Diaphragm:              Appears normal
 Choroid Plexus:        Appears normal         Stomach:                Appears normal, left
                                                                       sided
 Cerebellum:            Appears normal         Abdomen:                Appears normal
 Posterior Fossa:       Appears normal         Abdominal Wall:         Appears nml (cord
                                                                       insert, abd wall)
 Nuchal Fold:           Not applicable (>20    Cord Vessels:           Appears normal (3
                        wks GA)                                        vessel cord)
 Face:                  Appears normal         Kidneys:                Appear normal
                        (orbits and profile)
 Lips:                  Appears normal         Bladder:                Appears normal
 Thoracic:              Appears normal         Spine:                  Appears normal
 Heart:                 Appears normal         Upper Extremities:      Appears normal
                        (4CH, axis, and
                        situs)
 RVOT:                  Appears normal         Lower Extremities:      Appears normal
 LVOT:                  Appears normal

 Other:  Heels/feet and open hands/5th digits visualized. Nasal bone
         visualized.
Cervix Uterus Adnexa

 Cervix
 Length:           3.72  cm.
 Normal appearance by transabdominal scan.

 Uterus
 No abnormality visualized.

 Right Ovary
 Within normal limits.

 Left Ovary
 Within normal limits.

 Cul De Sac
 No free fluid seen.

 Adnexa
 No abnormality visualized.
Comments

 This patient was seen for a detailed fetal anatomy scan due
 to due to maternal obesity.  The the fetal cardiac views were
 unable to be fully visualized during an exam performed in
 your office.
 She denies any significant past medical history and denies
 any problems in her current pregnancy.
 The patient had a quad screen which indicated a Down
 syndrome risk of 1 in 9024.  An MSAFP was 2.34 MoM.
 She was informed that the fetal growth and amniotic fluid
 level were appropriate for her gestational age.
 Although limited due to maternal body habitus, there were no
 obvious fetal anomalies noted on today's ultrasound exam.
 The fetal cardiac views were visualized today.
 The patient was informed that anomalies may be missed due
 to technical limitations. If the fetus is in a suboptimal position
 or maternal habitus is increased, visualization of the fetus in
 the maternal uterus may be impaired.
 Follow up as indicated.

## 2022-04-27 ENCOUNTER — Inpatient Hospital Stay
Admit: 2022-04-27 | Discharge: 2022-04-28 | Disposition: A | Discharge status: Home / Self Care | Payer: PRIVATE HEALTH INSURANCE | Attending: Emergency Medicine

## 2022-04-27 DIAGNOSIS — J45901 Unspecified asthma with (acute) exacerbation: Secondary | ICD-10-CM

## 2022-04-27 NOTE — ED Provider Notes (Addendum)
[facility]  EMERGENCY DEPARTMENT ENCOUNTER      Pt Name: Raven Rowe  MRN: [id number]  Birthdate 04/20/1996  Date of evaluation: 04/27/2022  Provider: Ledell PeoplesZachary Brinley Rosete, MD    CHIEF COMPLAINT       Chief Complaint   Patient presents with    Shortness of Breath    Panic Attack         HISTORY OF PRESENT ILLNESS    26 year old female presenting ER with reported shortness of breath and wheezing and panic attack.    EKG: Sinus tachycardia rate of 116 with normal intervals, normal axis, no ST elevation or depressions.  EKG interpreted by Ledell PeoplesZachary Mikaela Hilgeman, MD      Nursing Notes were reviewed.    REVIEW OF SYSTEMS       Review of Systems      PAST MEDICAL HISTORY   No past medical history on file.      SURGICAL HISTORY     No past surgical history on file.      CURRENT MEDICATIONS       Discharge Medication List as of 04/27/2022 11:02 PM          ALLERGIES     Penicillins    FAMILY HISTORY     No family history on file.       SOCIAL HISTORY       Social History     Socioeconomic History    Marital status: Married       SCREENINGS         Glasgow Coma Scale  Eye Opening: Spontaneous  Best Verbal Response: Oriented  Best Motor Response: Obeys commands  Glasgow Coma Scale Score: 15                     CIWA Assessment  BP: (!) 159/95  Pulse: (!) 113                 PHYSICAL EXAM       ED Triage Vitals [04/27/22 1950]   BP Temp Temp Source Pulse Resp SpO2 Height Weight - Scale   (!) 134/106 98.3 F (36.8 C) Oral (!) 110 -- -- 5\' 3"  (1.6 m) 230 lb (104.3 kg)       Body mass index is 40.74 kg/m.    Physical Exam  Vitals and nursing note reviewed.   Constitutional:       General: She is not in acute distress.     Appearance: Normal appearance.   HENT:      Head: Normocephalic.      Mouth/Throat:      Pharynx: Oropharynx is clear.   Eyes:      Conjunctiva/sclera: Conjunctivae normal.   Cardiovascular:      Rate and Rhythm: Tachycardia present.   Pulmonary:      Effort: Pulmonary effort is normal. Tachypnea present. No  respiratory distress.      Breath sounds: Wheezing present.   Abdominal:      General: Abdomen is flat.      Palpations: Abdomen is soft.      Tenderness: There is no abdominal tenderness.   Musculoskeletal:         General: Normal range of motion.      Cervical back: Neck supple.   Skin:     General: Skin is warm.      Capillary Refill: Capillary refill takes less than 2 seconds.      Findings: No rash.   Neurological:  General: No focal deficit present.      Mental Status: She is alert and oriented to person, place, and time.       DIAGNOSTIC RESULTS     RADIOLOGY:   Interpretation per the Radiologist below, if available at the time of this note:    No orders to display        LABS:  Labs Reviewed   CBC WITH AUTO DIFFERENTIAL - Abnormal; Notable for the following components:       Result Value    WBC 13.5 (*)     Neutrophils % 93 (*)     Lymphocytes % 5 (*)     Monocytes % 2 (*)     Neutrophils Absolute 12.5 (*)     Lymphocytes Absolute 0.7 (*)     All other components within normal limits   COMPREHENSIVE METABOLIC PANEL - Abnormal; Notable for the following components:    Glucose 125 (*)     All other components within normal limits   MAGNESIUM       All other labs were within normal range or not returned as of this dictation.    EMERGENCY DEPARTMENT COURSE and DIFFERENTIAL DIAGNOSIS/MDM:   Medical Decision Making  26 year old female with history of asthma previously followed by pulmonologist but recently moved to the area.  Started having worsening shortness of breath over the last 1 to 2 days.  Has increased using her inhaler.  [facility] and x-ray with report of leukocytosis but no other abnormalities.  Was started on Medrol Dosepak.  This evening patient started feeling increased anxiety and shortness of breath was brought into the ER.  EKG showing sinus tachycardia without signs of ischemia.  On evaluation patient had increased work of breathing is tachycardic and tachypneic  with diffuse wheezing.  Ordered 3 DuoNeb treatments, Solu-Medrol and magnesium.  Ordered labs and electrolytes.  After breathing treatments wheezing significantly improved but still present.  Discussed hospitalization for continued treatment however patient reports feeling significantly better and would like to be discharged home.  I discussed signs and symptoms of warrant return back to ER for further evaluation.  Given referral information for pulmonologist.  We will change patient's Benadryl Dosepak to steroid burst dose course.  Reported any fevers.  Patient does have leukocytosis however patient has also been on steroids.  Lungs with no rhonchi and wheezing improved.    Total critical care time (not including time spent performing separately reportable procedures):      Amount and/or Complexity of Data Reviewed  External Data Reviewed: labs and notes.     Details: pulmonary  Labs: ordered. Decision-making details documented in ED Course.  ECG/medicine tests: ordered and independent interpretation performed. Decision-making details documented in ED Course.    Risk  Prescription drug management.            REASSESSMENT            CONSULTS:  None    PROCEDURES:  Unless otherwise noted below, none     Procedures      FINAL IMPRESSION      1. Exacerbation of asthma, unspecified asthma severity, unspecified whether persistent    2. Shortness of breath          DISPOSITION/PLAN   DISPOSITION Decision To Discharge 04/27/2022 11:01:55 PM      PATIENT REFERRED TO:  [facility] [employer].  [address]  [place] [address]  Call  DISCHARGE MEDICATIONS:  Discharge Medication List as of 04/27/2022 11:02 PM        START taking these medications    Details   predniSONE (DELTASONE) 20 MG tablet Take 2 tablets by mouth daily for 5 days, Disp-10 tablet, R-0Print               (Please note that portions of this note were completed with a voice recognition program.  Efforts were made  to edit the dictations but occasionally words are mis-transcribed.)    Ledell Peoples, MD (electronically signed)  Emergency Attending Physician / Physician Assistant / Nurse Practitioner              Ledell Peoples, MD  04/28/22 0533       Ledell Peoples, MD  04/28/22 [phone]

## 2022-04-27 NOTE — ED Triage Notes (Signed)
Arrived via ems with complaints of anxiety attack. Pt reports being at urgent care earlier for an asthma attack.

## 2022-04-27 NOTE — ED Notes (Signed)
Discharge instructions reviewed with patient and family. Opportunity for questions provided. IV removed, patient verbalized understanding of instructions.        Steffanie Dunn Mattis-Francis, RN  04/27/22 2312

## 2022-04-27 NOTE — ED Notes (Signed)
Patient reports that she feels much better in the chest but tight in the back.      Steffanie Dunn Mattis-Francis, RN  04/27/22 2250

## 2022-04-28 LAB — EKG 12-LEAD
Atrial Rate: 116 {beats}/min
P Axis: 68 degrees
P-R Interval: 142 ms
Q-T Interval: 352 ms
QRS Duration: 76 ms
QTc Calculation (Bazett): 489 ms
R Axis: 35 degrees
T Axis: 58 degrees
Ventricular Rate: 116 {beats}/min

## 2022-04-28 LAB — COMPREHENSIVE METABOLIC PANEL
ALT: 22 U/L (ref 10–35)
AST: 16 U/L (ref 10–35)
Albumin/Globulin Ratio: 1.4 (ref 1.1–2.2)
Albumin: 4.8 g/dL (ref 3.5–5.2)
Alk Phosphatase: 86 U/L (ref 35–104)
Anion Gap: 14 mmol/L (ref 5–15)
BUN: 9 MG/DL (ref 6–20)
Bun/Cre Ratio: 13 (ref 12–20)
CO2: 24 mmol/L (ref 22–29)
Calcium: 9.7 MG/DL (ref 8.6–10.0)
Chloride: 106 mmol/L (ref 98–107)
Creatinine: 0.71 MG/DL (ref 0.50–0.90)
Est, Glom Filt Rate: >60 mL/min/{1.73_m2} (ref >60)
Globulin: 3.4 g/dL (ref 2.0–4.0)
Glucose: 125 mg/dL — ABNORMAL HIGH (ref 65–100)
Potassium: 3.9 mmol/L (ref 3.5–5.1)
Sodium: 144 mmol/L (ref 136–145)
Total Bilirubin: 0.6 MG/DL (ref 0.2–1.0)
Total Protein: 8.2 g/dL (ref 6.4–8.3)

## 2022-04-28 LAB — CBC WITH AUTO DIFFERENTIAL
Absolute Immature Granulocyte: 0 10*3/uL (ref 0.00–0.04)
Basophils %: 0 % (ref 0–1)
Basophils Absolute: 0 10*3/uL (ref 0.0–0.1)
Eosinophils %: 0 % (ref 0–7)
Eosinophils Absolute: 0 10*3/uL (ref 0.0–0.4)
Hematocrit: 46.2 % (ref 35.0–47.0)
Hemoglobin: 15.7 g/dL (ref 11.5–16.0)
Immature Granulocytes: 0 % (ref 0.0–0.5)
Lymphocytes %: 5 % — ABNORMAL LOW (ref 12–49)
Lymphocytes Absolute: 0.7 10*3/uL — ABNORMAL LOW (ref 0.8–3.5)
MCH: 32 PG (ref 26.0–34.0)
MCHC: 34 g/dL (ref 30.0–36.5)
MCV: 94.3 FL (ref 80.0–99.0)
MPV: 10.1 FL (ref 8.9–12.9)
Monocytes %: 2 % — ABNORMAL LOW (ref 5–13)
Monocytes Absolute: 0.3 10*3/uL (ref 0.0–1.0)
Neutrophils %: 93 % — ABNORMAL HIGH (ref 32–75)
Neutrophils Absolute: 12.5 10*3/uL — ABNORMAL HIGH (ref 1.8–8.0)
Nucleated RBCs: 0 PER 100 WBC
Platelets: 232 10*3/uL (ref 150–400)
RBC: 4.9 M/uL (ref 3.80–5.20)
RDW: 13.2 % (ref 11.5–14.5)
WBC: 13.5 10*3/uL — ABNORMAL HIGH (ref 3.6–11.0)
nRBC: 0 10*3/uL (ref 0.00–0.01)

## 2022-04-28 LAB — MAGNESIUM: Magnesium: 2 mg/dL (ref 1.6–2.6)

## 2022-04-28 MED ORDER — MAGNESIUM SULFATE 2000 MG/50 ML IVPB PREMIX
2 GM/50ML | Freq: Once | INTRAVENOUS | Status: AC
Start: 2022-04-28 — End: 2022-04-27
  Administered 2022-04-28: 01:00:00 2000 mg via INTRAVENOUS

## 2022-04-28 MED ORDER — METHYLPREDNISOLONE SODIUM SUCC 125 MG IJ SOLR
125 MG | Freq: Once | INTRAMUSCULAR | Status: AC
Start: 2022-04-28 — End: 2022-04-27
  Administered 2022-04-28: 01:00:00 60 mg via INTRAVENOUS

## 2022-04-28 MED ORDER — IPRATROPIUM-ALBUTEROL 0.5-2.5 (3) MG/3ML IN SOLN
RESPIRATORY_TRACT | Status: AC
Start: 2022-04-28 — End: 2022-04-27
  Administered 2022-04-28 (×3): 1 via RESPIRATORY_TRACT

## 2022-04-28 MED ORDER — PREDNISONE 20 MG PO TABS
20 MG | ORAL_TABLET | Freq: Every day | ORAL | 0 refills | Status: AC
Start: 2022-04-28 — End: 2022-05-02

## 2022-04-28 MED FILL — SOLU-MEDROL 125 MG IJ SOLR: 125 MG | INTRAMUSCULAR | Qty: 125

## 2022-04-28 MED FILL — IPRATROPIUM-ALBUTEROL 0.5-2.5 (3) MG/3ML IN SOLN: RESPIRATORY_TRACT | Qty: 9

## 2022-04-28 MED FILL — MAGNESIUM SULFATE 2 GM/50ML IV SOLN: 2 GM/50ML | INTRAVENOUS | Qty: 50

## 2023-09-30 ENCOUNTER — Encounter

## 2023-09-30 ENCOUNTER — Ambulatory Visit: Admit: 2023-09-30 | Discharge: 2023-10-06 | Payer: MEDICAID

## 2023-09-30 DIAGNOSIS — Z0001 Encounter for general adult medical examination with abnormal findings: Secondary | ICD-10-CM

## 2023-09-30 MED ORDER — VENLAFAXINE HCL ER 37.5 MG PO CP24
37.5 MG | ORAL_CAPSULE | Freq: Every day | ORAL | 2 refills | Status: AC
Start: 2023-09-30 — End: 2023-12-06

## 2023-09-30 NOTE — Progress Notes (Signed)
1. Have you been to the ER, urgent care clinic since your last visit?  Hospitalized since your last visit?No    2. Have you seen or consulted any other health care providers outside of the [facility] since your last visit?  Include any pap smears or colon screening. No    Chief Complaint   Patient presents with    New Patient    Establish Care    Anxiety     Discuss medication.  Concerns that anxiety getting worse.      Sinus Problem     X2-3 days.  No fever  Head pressure and congestion.     Health Maintenance Due   Topic Date Due    Depression Screen  Never done    Varicella vaccine (1 of 2 - 13+ 2-dose series) Never done    HIV screen  Never done    Hepatitis C screen  Never done    Hepatitis B vaccine (1 of 3 - 19+ 3-dose series) Never done    DTaP/Tdap/Td vaccine (1 - Tdap) Never done    Pap smear  Never done    Flu vaccine (1) Never done    COVID-19 Vaccine (1 - 2023-24 season) Never done     PHQ-9 Total Score: 0 (09/30/2023  2:13 PM)        09/30/2023     2:00 PM   AMB Abuse Screening   Do you ever feel afraid of your partner? N   Are you in a relationship with someone who physically or mentally threatens you? N   Is it safe for you to go home? Y     Vitals:    09/30/23 1412   BP: 103/71   Pulse: 85   Resp: 16   Temp: 97.5 F (36.4 C)   SpO2: 98%

## 2023-09-30 NOTE — Progress Notes (Signed)
History of present illness:Raven Rowe is a 27 y.o. female presenting for New Patient, Establish Care, Anxiety (Discuss medication./Concerns that anxiety getting worse./), and Sinus Problem (X2-3 days./No fever/Head pressure and congestion.)    Ms. chondritis is here for routine appointment and established care.    History of anxiety and depression.  Patient has been taking Celexa for past 10 years, she feels it is not helping much.  She would like to try something different.  She denies any SI/HI thoughts.  Discussed with patient about trying different medication for her anxiety such as Effexor.  Patient is okay to try it.    She has 7 months old child at home, no breast-feeding per patient.    History of asthma and seasonal allergies.  Patient takes Singulair, Claritin and Advair.  Doing fine, however she feels some sinus congestion currently.  Denies any fever.    History of obesity.  Discussed with patient about risk and complications of obesity and overweight issues.    Patient Vape.    Patient denies any other concerns today    Review of Systems   Constitutional:  Negative for chills, fatigue and fever.   HENT:  Positive for congestion and rhinorrhea. Negative for ear pain, sinus pain and sore throat.    Eyes:  Negative for visual disturbance.   Respiratory:  Negative for cough and shortness of breath.    Cardiovascular:  Negative for chest pain and palpitations.   Gastrointestinal:  Negative for diarrhea, nausea and vomiting.   Genitourinary:  Negative for dysuria, flank pain, frequency and urgency.   Musculoskeletal:  Negative for arthralgias and joint swelling.   Skin:  Negative for rash.   Neurological:  Negative for dizziness, weakness and headaches.   Psychiatric/Behavioral:  Negative for behavioral problems and confusion.      Objective  Physical Exam  Vitals reviewed.   Constitutional:       General: She is not in acute distress.     Appearance: Normal appearance.   HENT:      Head:  Normocephalic and atraumatic.      Right Ear: Ear canal and external ear normal.      Left Ear: Ear canal and external ear normal.      Ears:      Comments: Bilateral TMs clear fluid behind, bulging.     Nose: Congestion and rhinorrhea present.      Mouth/Throat:      Mouth: Mucous membranes are moist.   Eyes:      Extraocular Movements: Extraocular movements intact.      Conjunctiva/sclera: Conjunctivae normal.      Pupils: Pupils are equal, round, and reactive to light.   Cardiovascular:      Rate and Rhythm: Normal rate and regular rhythm.      Pulses: Normal pulses.      Heart sounds: Normal heart sounds.   Pulmonary:      Effort: Pulmonary effort is normal.      Breath sounds: Normal breath sounds.   Abdominal:      General: Abdomen is flat. Bowel sounds are normal. There is no distension.      Palpations: Abdomen is soft.      Tenderness: There is no abdominal tenderness.   Musculoskeletal:         General: No swelling or tenderness. Normal range of motion.      Cervical back: Normal range of motion.   Skin:     General: Skin is warm and dry.  Findings: No rash.   Neurological:      General: No focal deficit present.      Mental Status: She is alert and oriented to person, place, and time.   Psychiatric:         Mood and Affect: Mood normal.         Behavior: Behavior normal.       Patient's medications, allergies, past medical, surgical, social and family histories were reviewed and updated as appropriate.    Allergies   Allergen Reactions    Penicillins Hives        Prior to Admission medications    Medication Sig Start Date End Date Taking? Authorizing Provider   ADVAIR DISKUS 250-50 MCG/ACT AEPB diskus inhaler Inhale 1 puff into the lungs daily 09/05/23  Yes [provider]   loratadine (CLARITIN) 10 MG tablet Take 1 tablet by mouth daily   Yes [provider]   Montelukast Sodium (SINGULAIR PO) Singulair   BROWN LINDA   Yes [provider]   venlafaxine (EFFEXOR XR) 37.5 MG  extended release capsule Take 1 capsule by mouth daily 09/30/23  Yes Eustace Pen, PA-C        Vitals:    09/30/23 1412   BP: 103/71   Site: Left Upper Arm   Position: Sitting   Cuff Size: Large Adult   Pulse: 85   Resp: 16   Temp: 97.5 F (36.4 C)   TempSrc: Skin   SpO2: 98%   Weight: 97.5 kg (215 lb)   Height: 1.6 m (5\' 3" )       Body mass index is 38.09 kg/m.    Assessment and Plan    1. Encounter for general adult medical examination with abnormal findings  -     CBC with Auto Differential; Future  -     Comprehensive Metabolic Panel; Future  -     Hemoglobin A1C; Future  -     TSH; Future  -     Lipid Panel; Future  2. GAD (generalized anxiety disorder)  -     venlafaxine (EFFEXOR XR) 37.5 MG extended release capsule; Take 1 capsule by mouth daily, Disp-30 capsule, R-2Normal  3. Mild intermittent asthma without complication  4. Seasonal allergies  5. History of depression  -     venlafaxine (EFFEXOR XR) 37.5 MG extended release capsule; Take 1 capsule by mouth daily, Disp-30 capsule, R-2Normal  6. Class 2 obesity due to excess calories without serious comorbidity with body mass index (BMI) of 38.0 to 38.9 in adult  -     CBC with Auto Differential; Future  -     Comprehensive Metabolic Panel; Future  -     Hemoglobin A1C; Future  -     TSH; Future  -     Lipid Panel; Future  7. Current every day vaping     Order for lab work, will follow-up with results.  Taper Celexa 40 mg dose, take half tablet for 1 week then stop it.  Start Effexor next day after stopping Celexa. Patient understands.  Continue with allergy and asthma medications.  Continue with the diet exercise and hydration.  Follow-up in 3 to 4 weeks.    Return in about 1 month (around 10/31/2023) for Follow up, anxiety.       Call 911 or go to ER if shortness of breath,Chest pain or worsening of symptoms.        Diagnosis and plan discussed with patient who  verbillized understanding.      Please note that this dictation may have been completed  with Dragon, the computer voice recognition software.  Quite often unanticipated grammatical, syntax, homophones, and other interpretive errors are inadvertently transcribed by the computer software.  Please disregard these errors.  Please excuse any errors that have escaped final proofreading.

## 2023-10-27 ENCOUNTER — Encounter

## 2023-11-04 ENCOUNTER — Ambulatory Visit: Admit: 2023-11-04 | Payer: MEDICAID

## 2023-11-04 VITALS — BP 134/82 | HR 98 | Temp 98.20000°F | Resp 17 | Ht 63.0 in | Wt 216.0 lb

## 2023-11-04 DIAGNOSIS — F411 Generalized anxiety disorder: Secondary | ICD-10-CM

## 2023-11-04 MED ORDER — BUSPIRONE HCL 7.5 MG PO TABS
7.5 MG | ORAL_TABLET | ORAL | 2 refills | Status: DC
Start: 2023-11-04 — End: 2024-04-17

## 2023-11-04 NOTE — Progress Notes (Signed)
 History of present illness:Raven Rowe is a 27 y.o. female presenting for 1 Month Follow-Up    [name] is here for 1 month follow-up.    Patient was seen last month for established care.    History of anxiety.  Patient used to take Celexa for

## 2023-11-04 NOTE — Progress Notes (Signed)
"  Have you been to the ER, urgent care clinic since your last visit?  Hospitalized since your last visit?"    NO    "Have you seen or consulted any other health care providers outside our system since your last visit?"    NO     "Have you had a pap smear?"

## 2023-11-24 MED ORDER — ADVAIR DISKUS 250-50 MCG/ACT IN AEPB
250-50 MCG/ACT | Freq: Every day | RESPIRATORY_TRACT | 2 refills | Status: DC
Start: 2023-11-24 — End: 2024-04-12

## 2023-11-24 NOTE — Telephone Encounter (Signed)
I need to refill my Advair but my pharmacy doesn't have a prescription from your office, only my old one that has run out of refills. I really need this medicine so I don't have an asthma flair up.   Thank you

## 2023-11-24 NOTE — Telephone Encounter (Signed)
Please see medication refill.    Thank you

## 2023-12-05 ENCOUNTER — Encounter: Admit: 2023-12-05

## 2023-12-05 DIAGNOSIS — F411 Generalized anxiety disorder: Secondary | ICD-10-CM

## 2023-12-06 MED ORDER — VENLAFAXINE HCL ER 37.5 MG PO CP24
37.5 MG | ORAL_CAPSULE | Freq: Every day | ORAL | 1 refills | Status: DC
Start: 2023-12-06 — End: 2024-04-17

## 2024-04-12 ENCOUNTER — Telehealth

## 2024-04-12 MED ORDER — ADVAIR DISKUS 250-50 MCG/ACT IN AEPB
250-50 | Freq: Every day | RESPIRATORY_TRACT | 2 refills | 30.00000 days | Status: DC
Start: 2024-04-12 — End: 2024-04-16

## 2024-04-12 NOTE — Telephone Encounter (Signed)
 Med rf, advair  inhaler.

## 2024-04-16 ENCOUNTER — Telehealth

## 2024-04-16 MED ORDER — FLUTICASONE-SALMETEROL 250-50 MCG/ACT IN AEPB
250-50 | Freq: Two times a day (BID) | RESPIRATORY_TRACT | 2 refills | 30.00000 days | Status: DC
Start: 2024-04-16 — End: 2024-04-17

## 2024-04-16 NOTE — Telephone Encounter (Signed)
 Rf generic Advair 

## 2024-04-17 ENCOUNTER — Ambulatory Visit: Admit: 2024-04-17 | Payer: PRIVATE HEALTH INSURANCE | Attending: Family

## 2024-04-17 ENCOUNTER — Ambulatory Visit: Payer: Medicaid (Managed Care)

## 2024-04-17 VITALS — BP 138/86 | HR 104 | Temp 97.90000°F | Resp 17 | Ht 63.0 in | Wt 223.2 lb

## 2024-04-17 DIAGNOSIS — J4531 Mild persistent asthma with (acute) exacerbation: Secondary | ICD-10-CM

## 2024-04-17 MED ORDER — VENLAFAXINE HCL ER 37.5 MG PO CP24
37.5 | ORAL_CAPSULE | Freq: Every day | ORAL | 1 refills | 90.00000 days | Status: AC
Start: 2024-04-17 — End: ?

## 2024-04-17 MED ORDER — MONTELUKAST SODIUM 10 MG PO TABS
10 | ORAL_TABLET | Freq: Every day | ORAL | 1 refills | 90.00000 days | Status: DC
Start: 2024-04-17 — End: 2024-09-27

## 2024-04-17 MED ORDER — FLUTICASONE-SALMETEROL 250-50 MCG/ACT IN AEPB
250-50 | Freq: Two times a day (BID) | RESPIRATORY_TRACT | 2 refills | 30.00000 days | Status: AC
Start: 2024-04-17 — End: ?

## 2024-04-17 MED ORDER — ALBUTEROL SULFATE HFA 108 (90 BASE) MCG/ACT IN AERS
108 | Freq: Four times a day (QID) | RESPIRATORY_TRACT | 1 refills | 25.00000 days | Status: AC | PRN
Start: 2024-04-17 — End: ?

## 2024-04-17 NOTE — Assessment & Plan Note (Addendum)
 Continue Claritin and Singulair daily  Recommended over-the-counter Flonase nasal spray

## 2024-04-17 NOTE — Progress Notes (Signed)
 History of present illness:Raven Rowe is a 28 y.o. female presenting for medication refill    Mild persistent asthma:  Patient taking Advair  250-5 mcg twice daily, Singulair 10 mg daily, albuterol  as needed. Using albuterol  approximately 1x twice monthly, unless having a flare up, then it's twice weekly.   No recent visits to the ED or night awakenings with shortness of breath.    Allergic Rhinitis:  Taking Claritin 10mg , Singulair 10mg   Interested in Allergy shots/referral to allergist    Anxiety:   Taking Effexor  37.5mg  ER  Has not needed buspirone .   Feels improved and like her symptoms are stable  She is in counseling and restarted 2 weeks ago.       Review of Systems   Constitutional:  Negative for fatigue.   Respiratory:  Positive for shortness of breath and wheezing. Negative for cough.    Cardiovascular:  Negative for chest pain and palpitations.   Neurological:  Negative for dizziness and headaches.   Psychiatric/Behavioral:  Negative for dysphoric mood. The patient is not nervous/anxious.            Past Medical History:   Diagnosis Date    Allergic rhinitis     Anxiety     Asthma      No past surgical history on file.  No family history on file.    Prior to Admission medications    Medication Sig Start Date End Date Taking? Authorizing Provider   fluticasone-salmeterol (ADVAIR ) 250-50 MCG/ACT AEPB diskus inhaler Inhale 1 puff into the lungs in the morning and 1 puff in the evening. 04/17/24  Yes Glenn Lange, APRN - CNP   venlafaxine  (EFFEXOR  XR) 37.5 MG extended release capsule Take 1 capsule by mouth daily 04/17/24  Yes Glenn Lange, APRN - CNP   montelukast (SINGULAIR) 10 MG tablet Take 1 tablet by mouth daily 04/17/24  Yes Glenn Lange, APRN - CNP   albuterol  sulfate HFA (VENTOLIN  HFA) 108 (90 Base) MCG/ACT inhaler Inhale 2 puffs into the lungs 4 times daily as needed for Wheezing 04/17/24  Yes Glenn Lange, APRN - CNP   loratadine (CLARITIN) 10 MG tablet Take 1 tablet by mouth daily    Yes [provider]        Allergies   Allergen Reactions    Penicillins Hives       Vitals:    04/17/24 1318   BP: 138/86   BP Site: Left Upper Arm   Patient Position: Sitting   BP Cuff Size: Medium Adult   Pulse: (!) 104   Resp: 17   Temp: 97.9 F (36.6 C)   TempSrc: Temporal   SpO2: 97%   Weight: 101.2 kg (223 lb 3.2 oz)   Height: 1.6 m (5\' 3" )     Body mass index is 39.54 kg/m.      Objective  Physical Exam  Vitals and nursing note reviewed.   Constitutional:       General: She is not in acute distress.     Appearance: Normal appearance.   Cardiovascular:      Rate and Rhythm: Normal rate and regular rhythm.   Pulmonary:      Effort: Pulmonary effort is normal.      Breath sounds: Wheezing (Diffuse) present.   Neurological:      Mental Status: She is alert.   Psychiatric:         Mood and Affect: Mood normal.         Behavior: Behavior normal.  Assessment and Plan    1. Mild persistent asthma with exacerbation  Assessment & Plan:  Rx Advair  250-50 mcg twice daily  Rx albuterol  as needed  Rx Singulair 10 mg  Patient referred to allergist  Follow-up 6 months for medication refills  Orders:  -     [facility], [place]  -     fluticasone-salmeterol (ADVAIR ) 250-50 MCG/ACT AEPB diskus inhaler; Inhale 1 puff into the lungs in the morning and 1 puff in the evening., Disp-3 each, R-2Generic Product.Normal  -     montelukast (SINGULAIR) 10 MG tablet; Take 1 tablet by mouth daily, Disp-90 tablet, R-1Normal  -     albuterol  sulfate HFA (VENTOLIN  HFA) 108 (90 Base) MCG/ACT inhaler; Inhale 2 puffs into the lungs 4 times daily as needed for Wheezing, Disp-54 g, R-1Normal  2. Non-seasonal allergic rhinitis, unspecified trigger  Assessment & Plan:  Continue Claritin and Singulair daily  Recommended over-the-counter Flonase nasal spray  Orders:  -     [facility], [place]  3. GAD (generalized anxiety disorder)  Assessment  & Plan:  Rx Effexor  37.5 mg daily  Continue counseling  Follow-up 6 months, sooner if needed  Orders:  -     venlafaxine  (EFFEXOR  XR) 37.5 MG extended release capsule; Take 1 capsule by mouth daily, Disp-90 capsule, R-1Normal       Return in about 6 months (around 10/18/2024) for Med refill.     Diagnosis and plan discussed with patient who verbillized understanding.

## 2024-04-17 NOTE — Assessment & Plan Note (Signed)
 Rx Effexor  37.5 mg daily  Continue counseling  Follow-up 6 months, sooner if needed

## 2024-04-17 NOTE — Assessment & Plan Note (Signed)
 Rx Advair  250-50 mcg twice daily  Rx albuterol  as needed  Rx Singulair 10 mg  Patient referred to allergist  Follow-up 6 months for medication refills

## 2024-04-17 NOTE — Progress Notes (Signed)
 dentified pt with two pt identifiers(name and DOB).    Chief Complaint   Patient presents with    Medication Refill     Patient here for inhaler refill    Also, pain in middle of back  Started about 2 weeks ago.  Feels she may have pulled a muscle.        Health Maintenance Due   Topic    Varicella vaccine (1 of 2 - 13+ 2-dose series)    HIV screen     Hepatitis C screen     Hepatitis B vaccine (1 of 3 - 19+ 3-dose series)    DTaP/Tdap/Td vaccine (1 - Tdap)    Pneumococcal 0-49 years Vaccine (1 of 2 - PCV)    Pap smear     COVID-19 Vaccine (1 - 2024-25 season)       Wt Readings from Last 3 Encounters:   04/17/24 101.2 kg (223 lb 3.2 oz)   11/04/23 98 kg (216 lb)   09/30/23 97.5 kg (215 lb)     Temp Readings from Last 3 Encounters:   04/17/24 97.9 F (36.6 C) (Temporal)   11/04/23 98.2 F (36.8 C) (Tympanic)   09/30/23 97.5 F (36.4 C) (Skin)     BP Readings from Last 3 Encounters:   04/17/24 138/86   11/04/23 134/82   09/30/23 103/71     Pulse Readings from Last 3 Encounters:   04/17/24 (!) 104   11/04/23 98   09/30/23 85           Coordination of Care Questionnaire:  :   1. "Have you been to the ER, urgent care clinic since your last visit?  Hospitalized since your last visit?" no    2. "Have you seen or consulted any other health care providers outside of the [facility] since your last visit?" no     3. For patients aged 44-75: Has the patient had a colonoscopy / FIT/ Cologuard? no      If the patient is female:    4. For patients aged 75-74: Has the patient had a mammogram within the past 2 years? no      5. For patients aged 21-65: Has the patient had a pap smear? no     3) Do you have an Advance Directive on file? no  Are you interested in receiving information about Advance Directives? no    Patient is accompanied by self I have received verbal consent from Tyasiah Faughnan to discuss any/all medical information while they are present in the room.

## 2024-05-04 ENCOUNTER — Encounter

## 2024-05-04 ENCOUNTER — Ambulatory Visit: Admit: 2024-05-04 | Discharge: 2024-05-08 | Payer: PRIVATE HEALTH INSURANCE

## 2024-05-04 VITALS — BP 138/89 | HR 109 | Temp 97.20000°F | Resp 17 | Ht 63.0 in | Wt 221.6 lb

## 2024-05-04 DIAGNOSIS — E66812 Obesity, class 2: Secondary | ICD-10-CM

## 2024-05-04 DIAGNOSIS — E6609 Other obesity due to excess calories: Secondary | ICD-10-CM

## 2024-05-04 NOTE — Progress Notes (Unsigned)
 dentified pt with two pt identifiers(name and DOB).    Chief Complaint   Patient presents with    Other     Patient requesting to start [facility] for weight management.        Health Maintenance Due   Topic    Varicella vaccine (1 of 2 - 13+ 2-dose series)    HIV screen     Hepatitis C screen     Hepatitis B vaccine (1 of 3 - 19+ 3-dose series)    DTaP/Tdap/Td vaccine (1 - Tdap)    Pneumococcal 0-49 years Vaccine (1 of 2 - PCV)    Pap smear     COVID-19 Vaccine (1 - 2024-25 season)       Wt Readings from Last 3 Encounters:   05/04/24 100.5 kg (221 lb 9.6 oz)   04/17/24 101.2 kg (223 lb 3.2 oz)   11/04/23 98 kg (216 lb)     Temp Readings from Last 3 Encounters:   04/17/24 97.9 F (36.6 C) (Temporal)   11/04/23 98.2 F (36.8 C) (Tympanic)     BP Readings from Last 3 Encounters:   04/17/24 138/86   11/04/23 134/82   09/30/23 103/71     Pulse Readings from Last 3 Encounters:   04/17/24 (!) 104   11/04/23 98   09/30/23 85           Coordination of Care Questionnaire:  :   1. "Have you been to the ER, urgent care clinic since your last visit?  Hospitalized since your last visit?" no    2. "Have you seen or consulted any other health care providers outside of the [facility] since your last visit?" no     3. For patients aged 54-75: Has the patient had a colonoscopy / FIT/ Cologuard? no      If the patient is female:    4. For patients aged 36-74: Has the patient had a mammogram within the past 2 years? no      5. For patients aged 21-65: Has the patient had a pap smear? no     3) Do you have an Advance Directive on file? no  Are you interested in receiving information about Advance Directives? no    Patient is accompanied by self I have received verbal consent from Raven Rowe to discuss any/all medical information while they are present in the room.

## 2024-05-04 NOTE — Progress Notes (Signed)
 History of present illness:Raven Rowe is a 28 y.o. female presenting for Other (Patient requesting to start [facility] for weight management.)    Ms. Peloso is here for lab and weight management.    History of overweight and obesity.  Currently patient's BMI is 39.25.  Patient would like to start on weight loss medication.  Patient has been trying diet and exercise but not much help per patient.  Discussed with patient about weight loss medications options and insurance approval process.  Patient denies any personal or family history of thyroid cancer history per patient.    History of anxiety.  Patient currently on Effexor  for anxiety.  Denies any SI/HI thoughts.  Doing fine per patient.  History of depression, doing fine denies any concerns related to depression at this point per patient.    History of asthma and seasonal allergies.  Patient currently on Advair , albuterol  inhalers and Singulair  and Claritin.  Patient denies any uncontrolled coughing or shortness of breath at this point.    Patient has a history of PCOS.    Patient has family history of diabetes.  Patient's sister recently got diagnosed with prediabetes.  Mother has diabetes.    Patient has pending lab work, will do it today.    Patient denies any other concerns today.    Review of Systems   Constitutional:  Negative for chills, fatigue and fever.   HENT:  Negative for congestion, ear pain, rhinorrhea and sore throat.    Eyes:  Negative for visual disturbance.   Respiratory:  Negative for cough and shortness of breath.    Cardiovascular:  Negative for chest pain and palpitations.   Gastrointestinal:  Negative for diarrhea, nausea and vomiting.   Genitourinary:  Negative for dysuria, flank pain, frequency and urgency.   Musculoskeletal:  Negative for arthralgias and joint swelling.   Skin:  Negative for rash.   Neurological:  Negative for dizziness, weakness and headaches.   Psychiatric/Behavioral:  Negative for behavioral problems and  confusion.      Objective  Physical Exam  Vitals reviewed.   Constitutional:       General: She is not in acute distress.     Appearance: She is obese.   HENT:      Head: Normocephalic and atraumatic.      Right Ear: Tympanic membrane, ear canal and external ear normal.      Left Ear: Tympanic membrane, ear canal and external ear normal.      Nose: Nose normal.      Mouth/Throat:      Mouth: Mucous membranes are moist.   Eyes:      Extraocular Movements: Extraocular movements intact.      Conjunctiva/sclera: Conjunctivae normal.      Pupils: Pupils are equal, round, and reactive to light.   Cardiovascular:      Rate and Rhythm: Normal rate and regular rhythm.      Pulses: Normal pulses.      Heart sounds: Normal heart sounds.   Pulmonary:      Effort: Pulmonary effort is normal.      Breath sounds: Normal breath sounds.   Abdominal:      General: Abdomen is flat.      Comments: Obese   Musculoskeletal:      Cervical back: Normal range of motion.      Right lower leg: No edema.      Left lower leg: No edema.   Skin:     General: Skin is warm  and dry.      Findings: No rash.   Neurological:      General: No focal deficit present.      Mental Status: She is alert and oriented to person, place, and time.   Psychiatric:         Mood and Affect: Mood normal.         Behavior: Behavior normal.         Patient's medications, allergies, past medical, surgical, social and family histories were reviewed and updated as appropriate.    Allergies   Allergen Reactions    Penicillins Hives        Prior to Admission medications    Medication Sig Start Date End Date Taking? Authorizing Provider   fluticasone -salmeterol (ADVAIR ) 250-50 MCG/ACT AEPB diskus inhaler Inhale 1 puff into the lungs in the morning and 1 puff in the evening. 04/17/24  Yes Glenn Lange, APRN - CNP   venlafaxine  (EFFEXOR  XR) 37.5 MG extended release capsule Take 1 capsule by mouth daily 04/17/24  Yes Glenn Lange, APRN - CNP   montelukast  (SINGULAIR ) 10 MG  tablet Take 1 tablet by mouth daily 04/17/24  Yes Glenn Lange, APRN - CNP   albuterol  sulfate HFA (VENTOLIN  HFA) 108 (90 Base) MCG/ACT inhaler Inhale 2 puffs into the lungs 4 times daily as needed for Wheezing 04/17/24  Yes Glenn Lange, APRN - CNP   loratadine (CLARITIN) 10 MG tablet Take 1 tablet by mouth daily   Yes [provider]        Vitals:    05/04/24 1539   BP: 138/89   BP Site: Left Upper Arm   Patient Position: Sitting   BP Cuff Size: Large Adult   Pulse: (!) 109   Resp: 17   Temp: 97.2 F (36.2 C)   TempSrc: Temporal   SpO2: 98%   Weight: 100.5 kg (221 lb 9.6 oz)   Height: 1.6 m (5\' 3" )       Body mass index is 39.25 kg/m.    Assessment and Plan    1. Class 2 obesity due to excess calories without serious comorbidity with body mass index (BMI) of 39.0 to 39.9 in adult  -     CBC with Auto Differential; Future  -     Comprehensive Metabolic Panel; Future  -     Hemoglobin A1C; Future  -     Lipid Panel; Future  -     TSH; Future  2. History of depression  3. Family history of diabetes mellitus  -     Hemoglobin A1C; Future  4. History of PCOS  5. GAD (generalized anxiety disorder)  6. Mild intermittent asthma without complication     Ordered ordered lab work, will follow-up with results.  Will start medication for weight loss after lab results.  Continue with the diet exercise and hydration.  Avoid fried oily and sugary foods.  Will follow-up after 1 month if weight loss medication approves, otherwise keep follow-up in 6 months as scheduled.    Return in about 6 months (around 11/04/2024) for Routine Appointment.       Call 911 or go to ER if shortness of breath,Chest pain or worsening of symptoms.        Diagnosis and plan discussed with patient who verbillized understanding.      Please note that this dictation may have been completed with Dragon, the computer voice recognition software.  Quite often unanticipated grammatical, syntax, homophones, and other interpretive errors  are  inadvertently transcribed by the computer software.  Please disregard these errors.  Please excuse any errors that have escaped final proofreading.

## 2024-05-05 LAB — CBC WITH AUTO DIFFERENTIAL
Basophils %: 0.6 % (ref 0.0–1.0)
Basophils Absolute: 0.07 10*3/uL (ref 0.00–0.10)
Eosinophils %: 4.5 % (ref 0.0–7.0)
Eosinophils Absolute: 0.53 10*3/uL — ABNORMAL HIGH (ref 0.00–0.40)
Hematocrit: 41.3 % (ref 35.0–47.0)
Hemoglobin: 12.8 g/dL (ref 11.5–16.0)
Immature Granulocytes %: 0.2 % (ref 0.0–0.5)
Immature Granulocytes Absolute: 0.02 10*3/uL (ref 0.00–0.04)
Lymphocytes %: 22.1 % (ref 12.0–49.0)
Lymphocytes Absolute: 2.61 10*3/uL (ref 0.80–3.50)
MCH: 26.6 pg (ref 26.0–34.0)
MCHC: 31 g/dL (ref 30.0–36.5)
MCV: 85.9 FL (ref 80.0–99.0)
MPV: 10.3 FL (ref 8.9–12.9)
Monocytes %: 6.8 % (ref 5.0–13.0)
Monocytes Absolute: 0.8 10*3/uL (ref 0.00–1.00)
Neutrophils %: 65.8 % (ref 32.0–75.0)
Neutrophils Absolute: 7.77 10*3/uL (ref 1.80–8.00)
Nucleated RBCs: 0 /100{WBCs}
Platelets: 312 10*3/uL (ref 150–400)
RBC: 4.81 M/uL (ref 3.80–5.20)
RDW: 14.4 % (ref 11.5–14.5)
WBC: 11.8 10*3/uL — ABNORMAL HIGH (ref 3.6–11.0)
nRBC: 0 10*3/uL (ref 0.00–0.01)

## 2024-05-05 LAB — LIPID PANEL
Chol/HDL Ratio: 4 (ref 0.0–5.0)
Cholesterol, Total: 197 mg/dL (ref <200)
HDL: 49 mg/dL
LDL Cholesterol: 77 mg/dL (ref 0–100)
Triglycerides: 355 mg/dL — ABNORMAL HIGH (ref <150)
VLDL Cholesterol Calculated: 71 mg/dL

## 2024-05-05 LAB — COMPREHENSIVE METABOLIC PANEL
ALT: 23 U/L (ref 12–78)
AST: 16 U/L (ref 15–37)
Albumin/Globulin Ratio: 1.1 (ref 1.1–2.2)
Albumin: 4.1 g/dL (ref 3.5–5.0)
Alk Phosphatase: 98 U/L (ref 45–117)
Anion Gap: 7 mmol/L (ref 2–12)
BUN/Creatinine Ratio: 14 (ref 12–20)
BUN: 14 mg/dL (ref 6–20)
CO2: 25 mmol/L (ref 21–32)
Calcium: 9.4 mg/dL (ref 8.5–10.1)
Chloride: 107 mmol/L (ref 97–108)
Creatinine: 0.99 mg/dL (ref 0.55–1.02)
Est, Glom Filt Rate: 80 mL/min/{1.73_m2} (ref >60)
Globulin: 3.7 g/dL (ref 2.0–4.0)
Glucose: 94 mg/dL (ref 65–100)
Potassium: 3.8 mmol/L (ref 3.5–5.1)
Sodium: 139 mmol/L (ref 136–145)
Total Bilirubin: 0.4 mg/dL (ref 0.2–1.0)
Total Protein: 7.8 g/dL (ref 6.4–8.2)

## 2024-05-05 LAB — HEMOGLOBIN A1C
Estimated Avg Glucose: 94 mg/dL
Hemoglobin A1C: 4.9 % (ref 4.0–5.6)

## 2024-05-05 LAB — TSH: TSH, 3rd Generation: 1.94 u[IU]/mL (ref 0.36–3.74)

## 2024-05-08 ENCOUNTER — Encounter

## 2024-05-08 MED ORDER — SEMAGLUTIDE-WEIGHT MANAGEMENT 0.25 MG/0.5ML SC SOAJ
0.25 | SUBCUTANEOUS | 1 refills | 56.00000 days | Status: AC
Start: 2024-05-08 — End: ?

## 2024-05-08 NOTE — Other (Signed)
 Recent lab results show,  Elevated triglycerides 355,  Patient needs to repeat fasting lipids, order is placed.  A1c 4.9%, nondiabetic.  Continue with the diet exercise and hydration.  Avoid fried oily and sugary foods.    CBC results showed mildly elevated WBC 11.8,  Any cough, congestion or urinary tract symptoms??    Rest of lab results came out fine with mild changes.    I have sent weight loss medication semaglutide to pharmacy, lets see if it gets approved.    Keep follow-up as scheduled.

## 2024-09-27 ENCOUNTER — Encounter

## 2024-09-27 MED ORDER — MONTELUKAST SODIUM 10 MG PO TABS
10 | ORAL_TABLET | ORAL | 0 refills | Status: AC
Start: 2024-09-27 — End: ?
